# Patient Record
Sex: Female | Born: 1935 | Race: White | Hispanic: No | Marital: Single | Smoking: Current every day smoker
Health system: Southern US, Community
[De-identification: ages and names within clinical notes are randomized; demographics above are authoritative.]

## PROBLEM LIST (undated history)

## (undated) DIAGNOSIS — F0390 Unspecified dementia without behavioral disturbance: Secondary | ICD-10-CM

## (undated) DIAGNOSIS — D649 Anemia, unspecified: Secondary | ICD-10-CM

## (undated) DIAGNOSIS — K922 Gastrointestinal hemorrhage, unspecified: Secondary | ICD-10-CM

## (undated) DIAGNOSIS — F03A0 Unspecified dementia, mild, without behavioral disturbance, psychotic disturbance, mood disturbance, and anxiety: Secondary | ICD-10-CM

## (undated) DIAGNOSIS — J45909 Unspecified asthma, uncomplicated: Secondary | ICD-10-CM

## (undated) DIAGNOSIS — E46 Unspecified protein-calorie malnutrition: Secondary | ICD-10-CM

## (undated) HISTORY — PX: ABDOMINAL HYSTERECTOMY: SHX81

## (undated) HISTORY — PX: TONSILLECTOMY: SUR1361

## (undated) HISTORY — PX: APPENDECTOMY: SHX54

---

## 2012-11-14 ENCOUNTER — Other Ambulatory Visit (HOSPITAL_COMMUNITY): Payer: Self-pay | Admitting: Family Medicine

## 2012-11-14 DIAGNOSIS — Z139 Encounter for screening, unspecified: Secondary | ICD-10-CM

## 2012-11-18 ENCOUNTER — Other Ambulatory Visit (HOSPITAL_COMMUNITY): Payer: Self-pay

## 2014-08-20 ENCOUNTER — Ambulatory Visit (HOSPITAL_COMMUNITY)
Admission: RE | Admit: 2014-08-20 | Discharge: 2014-08-20 | Disposition: A | Discharge status: Home / Self Care | Payer: Medicare Other | Source: Ambulatory Visit | Attending: Physician Assistant | Admitting: Physician Assistant

## 2014-08-20 ENCOUNTER — Other Ambulatory Visit (HOSPITAL_COMMUNITY): Payer: Self-pay | Admitting: Physician Assistant

## 2014-08-20 DIAGNOSIS — R10819 Abdominal tenderness, unspecified site: Secondary | ICD-10-CM

## 2014-08-20 DIAGNOSIS — R52 Pain, unspecified: Secondary | ICD-10-CM

## 2014-08-20 DIAGNOSIS — R112 Nausea with vomiting, unspecified: Secondary | ICD-10-CM

## 2014-08-20 DIAGNOSIS — K921 Melena: Secondary | ICD-10-CM | POA: Diagnosis not present

## 2014-08-21 ENCOUNTER — Encounter (HOSPITAL_COMMUNITY): Payer: Self-pay | Admitting: Emergency Medicine

## 2014-08-21 ENCOUNTER — Emergency Department (HOSPITAL_COMMUNITY): Payer: Medicare Other

## 2014-08-21 ENCOUNTER — Inpatient Hospital Stay (HOSPITAL_COMMUNITY)
Admission: EM | Admit: 2014-08-21 | Discharge: 2014-08-25 | DRG: 377 | Disposition: A | Discharge status: Home / Self Care | Payer: Medicare Other | Attending: Family Medicine | Admitting: Family Medicine

## 2014-08-21 DIAGNOSIS — D500 Iron deficiency anemia secondary to blood loss (chronic): Secondary | ICD-10-CM | POA: Diagnosis present

## 2014-08-21 DIAGNOSIS — K644 Residual hemorrhoidal skin tags: Secondary | ICD-10-CM | POA: Diagnosis present

## 2014-08-21 DIAGNOSIS — D508 Other iron deficiency anemias: Secondary | ICD-10-CM

## 2014-08-21 DIAGNOSIS — J45909 Unspecified asthma, uncomplicated: Secondary | ICD-10-CM | POA: Diagnosis present

## 2014-08-21 DIAGNOSIS — K921 Melena: Principal | ICD-10-CM | POA: Diagnosis present

## 2014-08-21 DIAGNOSIS — F17200 Nicotine dependence, unspecified, uncomplicated: Secondary | ICD-10-CM | POA: Diagnosis present

## 2014-08-21 DIAGNOSIS — K449 Diaphragmatic hernia without obstruction or gangrene: Secondary | ICD-10-CM | POA: Diagnosis present

## 2014-08-21 DIAGNOSIS — K5730 Diverticulosis of large intestine without perforation or abscess without bleeding: Secondary | ICD-10-CM | POA: Diagnosis present

## 2014-08-21 DIAGNOSIS — D62 Acute posthemorrhagic anemia: Secondary | ICD-10-CM | POA: Diagnosis present

## 2014-08-21 DIAGNOSIS — K639 Disease of intestine, unspecified: Secondary | ICD-10-CM | POA: Diagnosis present

## 2014-08-21 DIAGNOSIS — Z681 Body mass index (BMI) 19 or less, adult: Secondary | ICD-10-CM

## 2014-08-21 DIAGNOSIS — D649 Anemia, unspecified: Secondary | ICD-10-CM | POA: Diagnosis present

## 2014-08-21 DIAGNOSIS — K2990 Gastroduodenitis, unspecified, without bleeding: Secondary | ICD-10-CM | POA: Diagnosis present

## 2014-08-21 DIAGNOSIS — E43 Unspecified severe protein-calorie malnutrition: Secondary | ICD-10-CM | POA: Diagnosis present

## 2014-08-21 DIAGNOSIS — K2970 Gastritis, unspecified, without bleeding: Secondary | ICD-10-CM | POA: Diagnosis present

## 2014-08-21 DIAGNOSIS — F0390 Unspecified dementia without behavioral disturbance: Secondary | ICD-10-CM | POA: Diagnosis present

## 2014-08-21 DIAGNOSIS — J4530 Mild persistent asthma, uncomplicated: Secondary | ICD-10-CM

## 2014-08-21 DIAGNOSIS — D509 Iron deficiency anemia, unspecified: Secondary | ICD-10-CM

## 2014-08-21 HISTORY — DX: Unspecified asthma, uncomplicated: J45.909

## 2014-08-21 LAB — CBC WITH DIFFERENTIAL/PLATELET
BASOS ABS: 0 10*3/uL (ref 0.0–0.1)
BASOS PCT: 0 % (ref 0–1)
EOS ABS: 0.1 10*3/uL (ref 0.0–0.7)
EOS PCT: 1 % (ref 0–5)
HCT: 18.9 % — ABNORMAL LOW (ref 36.0–46.0)
Hemoglobin: 6 g/dL — CL (ref 12.0–15.0)
LYMPHS ABS: 1.1 10*3/uL (ref 0.7–4.0)
Lymphocytes Relative: 23 % (ref 12–46)
MCH: 29.9 pg (ref 26.0–34.0)
MCHC: 31.7 g/dL (ref 30.0–36.0)
MCV: 94 fL (ref 78.0–100.0)
Monocytes Absolute: 0.4 10*3/uL (ref 0.1–1.0)
Monocytes Relative: 9 % (ref 3–12)
NEUTROS PCT: 66 % (ref 43–77)
Neutro Abs: 3 10*3/uL (ref 1.7–7.7)
PLATELETS: 233 10*3/uL (ref 150–400)
RBC: 2.01 MIL/uL — ABNORMAL LOW (ref 3.87–5.11)
RDW: 15.6 % — AB (ref 11.5–15.5)
WBC: 4.6 10*3/uL (ref 4.0–10.5)

## 2014-08-21 LAB — BASIC METABOLIC PANEL
Anion gap: 8 (ref 5–15)
BUN: 31 mg/dL — ABNORMAL HIGH (ref 6–23)
CALCIUM: 8.8 mg/dL (ref 8.4–10.5)
CO2: 27 mEq/L (ref 19–32)
Chloride: 105 mEq/L (ref 96–112)
Creatinine, Ser: 0.62 mg/dL (ref 0.50–1.10)
GFR, EST NON AFRICAN AMERICAN: 84 mL/min — AB (ref >90)
GLUCOSE: 103 mg/dL — AB (ref 70–99)
Potassium: 4.1 mEq/L (ref 3.7–5.3)
SODIUM: 140 meq/L (ref 137–147)

## 2014-08-21 LAB — HEPATIC FUNCTION PANEL
ALBUMIN: 3.2 g/dL — AB (ref 3.5–5.2)
ALK PHOS: 53 U/L (ref 39–117)
ALT: 11 U/L (ref 0–35)
AST: 17 U/L (ref 0–37)
Bilirubin, Direct: <0.2 mg/dL (ref 0.0–0.3)
Total Bilirubin: 0.2 mg/dL — ABNORMAL LOW (ref 0.3–1.2)
Total Protein: 5.6 g/dL — ABNORMAL LOW (ref 6.0–8.3)

## 2014-08-21 LAB — RETICULOCYTES
RBC.: 2.04 MIL/uL — AB (ref 3.87–5.11)
RETIC CT PCT: 10.7 % — AB (ref 0.4–3.1)
Retic Count, Absolute: 218.3 10*3/uL — ABNORMAL HIGH (ref 19.0–186.0)

## 2014-08-21 LAB — ABO/RH: ABO/RH(D): O POS

## 2014-08-21 LAB — PREPARE RBC (CROSSMATCH)

## 2014-08-21 MED ORDER — MOMETASONE FURO-FORMOTEROL FUM 100-5 MCG/ACT IN AERO
2.0000 | INHALATION_SPRAY | Freq: Two times a day (BID) | RESPIRATORY_TRACT | Status: DC
  Administered 2014-08-22 – 2014-08-25 (×6): 2 via RESPIRATORY_TRACT
  Filled 2014-08-21 (×2): qty 8.8

## 2014-08-21 MED ORDER — ONDANSETRON HCL 4 MG/2ML IJ SOLN: 4.0000 mg | Freq: Four times a day (QID) | INTRAMUSCULAR | Status: DC | PRN

## 2014-08-21 MED ORDER — IPRATROPIUM BROMIDE HFA 17 MCG/ACT IN AERS: 2.0000 | INHALATION_SPRAY | Freq: Four times a day (QID) | RESPIRATORY_TRACT | Status: DC

## 2014-08-21 MED ORDER — SODIUM CHLORIDE 0.9 % IV SOLN: 250.0000 mL | INTRAVENOUS | Status: DC | PRN

## 2014-08-21 MED ORDER — ONDANSETRON HCL 4 MG PO TABS: 4.0000 mg | ORAL_TABLET | Freq: Four times a day (QID) | ORAL | Status: DC | PRN

## 2014-08-21 MED ORDER — ACETAMINOPHEN 650 MG RE SUPP: 650.0000 mg | Freq: Four times a day (QID) | RECTAL | Status: DC | PRN

## 2014-08-21 MED ORDER — SODIUM CHLORIDE 0.9 % IJ SOLN
3.0000 mL | Freq: Two times a day (BID) | INTRAMUSCULAR | Status: DC
  Administered 2014-08-22 – 2014-08-25 (×8): 3 mL via INTRAVENOUS

## 2014-08-21 MED ORDER — PANTOPRAZOLE SODIUM 40 MG IV SOLR
40.0000 mg | Freq: Once | INTRAVENOUS | Status: AC
  Administered 2014-08-21: 40 mg via INTRAVENOUS
  Filled 2014-08-21: qty 40

## 2014-08-21 MED ORDER — SODIUM CHLORIDE 0.9 % IV SOLN
Freq: Once | INTRAVENOUS | Status: AC
  Administered 2014-08-21: 22:00:00 via INTRAVENOUS

## 2014-08-21 MED ORDER — SODIUM CHLORIDE 0.9 % IJ SOLN: 3.0000 mL | INTRAMUSCULAR | Status: DC | PRN

## 2014-08-21 MED ORDER — ACETAMINOPHEN 325 MG PO TABS
650.0000 mg | ORAL_TABLET | Freq: Four times a day (QID) | ORAL | Status: DC | PRN
  Administered 2014-08-22 – 2014-08-25 (×4): 650 mg via ORAL
  Filled 2014-08-21 (×4): qty 2

## 2014-08-21 MED ORDER — PANTOPRAZOLE SODIUM 40 MG IV SOLR
40.0000 mg | Freq: Two times a day (BID) | INTRAVENOUS | Status: DC
  Administered 2014-08-22 – 2014-08-24 (×6): 40 mg via INTRAVENOUS
  Filled 2014-08-21 (×6): qty 40

## 2014-08-21 NOTE — ED Notes (Signed)
CRITICAL VALUE ALERT  Critical value received: 6.0  Date of notification:  08/21/2014  Time of notification:  2134  Critical value read back:Yes.    Nurse who received alert:  *SB**  MD notified (1st page):  2134   Time of first page:  2134  MD notified (2nd page):  Time of second page:  Responding MD:  ZAMMIT  Time MD responded:               2134

## 2014-08-21 NOTE — H&P (Addendum)
PCP:   Quinn Axe, PA-C   Chief Complaint:  Generalized weakness  HPI: 80 year old female with a history of asthma who came to the ED with chief complaint of generalized weakness and black stools. Patient says that she has been having these stools over the past one month and has noticed worsening fatigue and weakness. She admits to having some shortness of breath on exertion and dizziness on walking but denies passing out. Patient denies taking NSAIDs including aspirin or Motrin for pain she takes acetaminophen for pain. She denies any vomiting, but admits to having some nausea. She denies chest pain , no fever or dysuria. She denies diarrhea. In the ED patient found to have guaiac-positive stools, with anemia with hemoglobin of 6.0. 1 unit of blood transfusion has been ordered by the ED physician.  Allergies:   Allergies  Allergen Reactions  . Strawberry       Past Medical History  Diagnosis Date  . Asthma     Past Surgical History  Procedure Laterality Date  . Abdominal hysterectomy    . Tonsillectomy    . Appendectomy      Prior to Admission medications   Medication Sig Start Date End Date Taking? Authorizing Provider  Esomeprazole Magnesium (NEXIUM PO) Take 22.3 mg by mouth daily.   Yes Historical Provider, MD  ferrous sulfate 325 (65 FE) MG tablet Take 325 mg by mouth daily with breakfast.   Yes Historical Provider, MD  Fluticasone-Salmeterol (ADVAIR) 250-50 MCG/DOSE AEPB Inhale 1 puff into the lungs 2 (two) times daily.   Yes Historical Provider, MD  ipratropium (ATROVENT HFA) 17 MCG/ACT inhaler Inhale 2 puffs into the lungs 4 (four) times daily.   Yes Historical Provider, MD  Multiple Vitamins-Minerals (COMPLETE WOMENS) TABS Take 1 tablet by mouth daily.   Yes Historical Provider, MD    Social History:  reports that she has been smoking.  She does not have any smokeless tobacco history on file. She reports that she drinks alcohol. Her drug history is not  on file.  History reviewed. No pertinent family history.   All the positives are listed in BOLD  Review of Systems:  HEENT: Headache, blurred vision, runny nose, sore throat Neck: Hypothyroidism, hyperthyroidism,,lymphadenopathy Chest : Shortness of breath, history of COPD, Asthma Heart : Chest pain, history of coronary arterey disease GI:  Nausea, vomiting, diarrhea, constipation, GERD GU: Dysuria, urgency, frequency of urination, hematuria Neuro: Stroke, seizures, syncope Psych: Depression, anxiety, hallucinations   Physical Exam: Blood pressure 107/58, pulse 84, temperature 98 F (36.7 C), temperature source Oral, resp. rate 22, height 5' 2.5" (1.588 m), weight 43.545 kg (96 lb), SpO2 100.00%. Constitutional:   Patient is a well-developed and well-nourished female* in no acute distress and cooperative with exam. Head: Normocephalic and atraumatic Mouth: Mucus membranes moist Eyes: PERRL, EOMI, conjunctivae normal Neck: Supple, No Thyromegaly Cardiovascular: RRR, S1 normal, S2 normal Pulmonary/Chest: CTAB, no wheezes, rales, or rhonchi Abdominal: Soft. Non-tender, non-distended, bowel sounds are normal, no masses, organomegaly, or guarding present.  Neurological: A&O x3, Strenght is normal and symmetric bilaterally, cranial nerve II-XII are grossly intact, no focal motor deficit, sensory intact to light touch bilaterally.  Extremities : No Cyanosis, Clubbing or Edema  Labs on Admission:  Basic Metabolic Panel:  Recent Labs Lab 08/21/14 2100  NA 140  K 4.1  CL 105  CO2 27  GLUCOSE 103*  BUN 31*  CREATININE 0.62  CALCIUM 8.8   Liver Function Tests:  Recent Labs Lab 08/21/14 2100  AST 17  ALT 11  ALKPHOS 53  BILITOT 0.2*  PROT 5.6*  ALBUMIN 3.2*   No results found for this basename: LIPASE, AMYLASE,  in the last 168 hours No results found for this basename: AMMONIA,  in the last 168 hours CBC:  Recent Labs Lab 08/21/14 2100  WBC 4.6  NEUTROABS 3.0    HGB 6.0*  HCT 18.9*  MCV 94.0  PLT 233   C Radiological Exams on Admission: Dg Abd 1 View  08/20/2014   CLINICAL DATA:  Tenderness.  Nausea and vomiting .  EXAM: ABDOMEN - 1 VIEW  COMPARISON:  None.  FINDINGS: Soft tissue structures unremarkable. Gas pattern nonspecific. Rounded prominent calcific density in the pelvis. This could represent calcified fibroid or dermoid. Punctate calcification lower pelvis consistent with phlebolith. Degenerative changes lumbar spine and both hips. No acute bony abnormality.  IMPRESSION: 1. No acute abnormality.  No bowel distention. 2. Coarse calcification pelvis most likely calcified fibroid or dermoid.   Electronically Signed   By: Maisie Fus  Register   On: 08/20/2014 17:23   Dg Chest Portable 1 View  08/21/2014   CLINICAL DATA:  Weakness and shortness of breath.  EXAM: PORTABLE CHEST - 1 VIEW  COMPARISON:  12/01/2013  FINDINGS: Normal heart size and pulmonary vascularity. Emphysematous changes and scattered fibrosis in the lungs. No focal airspace disease or consolidation. No blunting of costophrenic angles. No pneumothorax. Calcification and torsion of the aorta.  IMPRESSION: Emphysematous changes in the lungs. No evidence of active pulmonary disease.   Electronically Signed   By: Burman Nieves M.D.   On: 08/21/2014 22:12    EKG: Independently reviewed. Sinus rhythm, nonspecific ST-T changes   Assessment/Plan Principal Problem:   Anemia Active Problems:   Melena   Asthma, chronic  Anemia Likely due to GI bleed, patient has guaiac-positive stools. Will transfuse 2 units PRBC Follow CBC in a.m.  Melena ? Cause, patient denies NSAID use. We'll keep the patient n.p.o. obtain GI consult in the a.m. for possible upper GI endoscopy. Will continue Protonix IV 40 mg every 12 hours.  Chronic asthma Continue Advair, ipratropium.   Code status: Patient is full code  Family discussion: No family at bedside   Time Spent on Admission: 60  minutes  Melanie Holland S [facility] Pager: [phone] 08/22/2014, 12:01 AM  If 7PM-7AM, please contact night-coverage  [email or web address]  Password [email or web address]

## 2014-08-21 NOTE — ED Provider Notes (Signed)
CSN: [id number]     Arrival date & time 08/21/14  2027 History  This chart was scribed for Benny Lennert, MD by Modena Jansky, ED Scribe. This patient was seen in room APA18/APA18 and the patient's care was started at 9:26 PM.  Chief Complaint  Patient presents with  . Weakness   Patient is a 80 y.o. female presenting with weakness and hematochezia. The history is provided by the patient. No language interpreter was used.  Weakness This is a new problem. The current episode started 12 to 24 hours ago. Pertinent negatives include no chest pain, no abdominal pain and no headaches. Nothing aggravates the symptoms. Nothing relieves the symptoms. She has tried nothing for the symptoms.  Rectal Bleeding Quality:  Black and tarry Amount:  Moderate Duration:  1 month Timing:  Intermittent Progression:  Unchanged Chronicity:  New Relieved by:  None tried Worsened by:  Nothing tried Ineffective treatments:  None tried Associated symptoms: no abdominal pain    HPI Comments: Melanie Holland is a 80 y.o. female who presents to the Emergency Department complaining of black stools that started a month ago. She states that she has also been feeling weak and nauseous today. She reports some neck pain. She states that she currently lives with her brother. She denies any fever or chills.   Past Medical History  Diagnosis Date  . Asthma    Past Surgical History  Procedure Laterality Date  . Abdominal hysterectomy    . Tonsillectomy    . Appendectomy     History reviewed. No pertinent family history. History  Substance Use Topics  . Smoking status: Current Every Day Smoker  . Smokeless tobacco: Not on file  . Alcohol Use: Yes     Comment: occasionally   OB History   Grav Para Term Preterm Abortions TAB SAB Ect Mult Living                 Review of Systems  Constitutional: Negative for appetite change and fatigue.  HENT: Negative for congestion, ear discharge and sinus pressure.   Eyes:  Negative for discharge.  Respiratory: Negative for cough.   Cardiovascular: Negative for chest pain.  Gastrointestinal: Positive for nausea and hematochezia. Negative for abdominal pain and diarrhea.  Genitourinary: Negative for frequency and hematuria.  Musculoskeletal: Positive for neck pain. Negative for back pain.  Skin: Negative for rash.  Neurological: Positive for weakness. Negative for seizures and headaches.  Psychiatric/Behavioral: Negative for hallucinations.      Allergies  Strawberry  Home Medications   Prior to Admission medications   Medication Sig Start Date End Date Taking? Authorizing Provider  Esomeprazole Magnesium (NEXIUM PO) Take 22.3 mg by mouth daily.   Yes Historical Provider, MD  ferrous sulfate 325 (65 FE) MG tablet Take 325 mg by mouth daily with breakfast.   Yes Historical Provider, MD  Fluticasone-Salmeterol (ADVAIR) 250-50 MCG/DOSE AEPB Inhale 1 puff into the lungs 2 (two) times daily.   Yes Historical Provider, MD  ipratropium (ATROVENT HFA) 17 MCG/ACT inhaler Inhale 2 puffs into the lungs 4 (four) times daily.   Yes Historical Provider, MD  Multiple Vitamins-Minerals (COMPLETE WOMENS) TABS Take 1 tablet by mouth daily.   Yes Historical Provider, MD   BP 110/57  Pulse 87  Temp(Src) 97.8 F (36.6 C) (Oral)  Resp 18  Ht 5' 2.5" (1.588 m)  Wt 92 lb (41.731 kg)  BMI 16.55 kg/m2  SpO2 100% Physical Exam  Nursing note and vitals reviewed. Constitutional: She  is oriented to person, place, and time. She appears well-developed.  Mildy confused. Cachetic.  HENT:  Head: Normocephalic.  Eyes: Conjunctivae and EOM are normal. No scleral icterus.  Neck: Neck supple. No thyromegaly present.  Cardiovascular: Normal rate and regular rhythm.  Exam reveals no gallop and no friction rub.   No murmur heard. Pulmonary/Chest: No stridor. She has no wheezes. She has no rales. She exhibits no tenderness.  Abdominal: She exhibits no distension. There is no  tenderness. There is no rebound.  Genitourinary:  Rectal exam is normal with heme positive.   Musculoskeletal: Normal range of motion. She exhibits no edema.  Lymphadenopathy:    She has no cervical adenopathy.  Neurological: She is oriented to person, place, and time. She exhibits normal muscle tone. Coordination normal.  Skin: No rash noted. No erythema.  Psychiatric: She has a normal mood and affect. Her behavior is normal.    ED Course  Procedures (including critical care time) DIAGNOSTIC STUDIES: Oxygen Saturation is 100% on RA, normal by my interpretation.    COORDINATION OF CARE: 9:30 PM- Pt advised of plan for treatment which includes labs and pt agrees.  Labs Review Labs Reviewed  CBC WITH DIFFERENTIAL - Abnormal; Notable for the following:    RBC 2.01 (*)    Hemoglobin 6.0 (*)    HCT 18.9 (*)    RDW 15.6 (*)    All other components within normal limits  BASIC METABOLIC PANEL - Abnormal; Notable for the following:    Glucose, Bld 103 (*)    BUN 31 (*)    GFR calc non Af Amer 84 (*)    All other components within normal limits  HEPATIC FUNCTION PANEL    Imaging Review Dg Abd 1 View  08/20/2014   CLINICAL DATA:  Tenderness.  Nausea and vomiting .  EXAM: ABDOMEN - 1 VIEW  COMPARISON:  None.  FINDINGS: Soft tissue structures unremarkable. Gas pattern nonspecific. Rounded prominent calcific density in the pelvis. This could represent calcified fibroid or dermoid. Punctate calcification lower pelvis consistent with phlebolith. Degenerative changes lumbar spine and both hips. No acute bony abnormality.  IMPRESSION: 1. No acute abnormality.  No bowel distention. 2. Coarse calcification pelvis most likely calcified fibroid or dermoid.   Electronically Signed   By: Maisie Fus  Register   On: 08/20/2014 17:23     EKG Interpretation None     CRITICAL CARE Performed by: Eevie Lapp L Total critical care time: 35 Critical care time was exclusive of separately billable  procedures and treating other patients. Critical care was necessary to treat or prevent imminent or life-threatening deterioration. Critical care was time spent personally by me on the following activities: development of treatment plan with patient and/or surrogate as well as nursing, discussions with consultants, evaluation of patient's response to treatment, examination of patient, obtaining history from patient or surrogate, ordering and performing treatments and interventions, ordering and review of laboratory studies, ordering and review of radiographic studies, pulse oximetry and re-evaluation of patient's condition.   MDM   Final diagnoses:  None    The chart was scribed for me under my direct supervision.  I personally performed the history, physical, and medical decision making and all procedures in the evaluation of this patient.Benny Lennert, MD 08/21/14 2234

## 2014-08-21 NOTE — ED Notes (Signed)
Pt brought in by [facility] for c/o weakness, pt has had nausea and black tarry stools; pt c/o left lower quad pain

## 2014-08-21 NOTE — ED Notes (Signed)
Report given to [facility] on 300. Pt's brother and sister-in-law informed of room number and took pt's pocketbook and meds with them

## 2014-08-22 ENCOUNTER — Encounter (HOSPITAL_COMMUNITY)
Admission: EM | Disposition: A | Discharge status: Home / Self Care | Payer: Self-pay | Source: Home / Self Care | Attending: Internal Medicine

## 2014-08-22 DIAGNOSIS — D649 Anemia, unspecified: Secondary | ICD-10-CM

## 2014-08-22 DIAGNOSIS — K259 Gastric ulcer, unspecified as acute or chronic, without hemorrhage or perforation: Secondary | ICD-10-CM

## 2014-08-22 DIAGNOSIS — K921 Melena: Secondary | ICD-10-CM

## 2014-08-22 DIAGNOSIS — K449 Diaphragmatic hernia without obstruction or gangrene: Secondary | ICD-10-CM

## 2014-08-22 DIAGNOSIS — J45909 Unspecified asthma, uncomplicated: Secondary | ICD-10-CM | POA: Diagnosis present

## 2014-08-22 HISTORY — PX: ESOPHAGOGASTRODUODENOSCOPY: SHX5428

## 2014-08-22 LAB — PROTIME-INR
INR: 1.04 (ref 0.00–1.49)
PROTHROMBIN TIME: 13.6 s (ref 11.6–15.2)

## 2014-08-22 LAB — CBC
HCT: 18.2 % — ABNORMAL LOW (ref 36.0–46.0)
Hemoglobin: 5.9 g/dL — CL (ref 12.0–15.0)
MCH: 30.4 pg (ref 26.0–34.0)
MCHC: 32.4 g/dL (ref 30.0–36.0)
MCV: 93.8 fL (ref 78.0–100.0)
Platelets: 229 10*3/uL (ref 150–400)
RBC: 1.94 MIL/uL — ABNORMAL LOW (ref 3.87–5.11)
RDW: 15.9 % — AB (ref 11.5–15.5)
WBC: 4.9 10*3/uL (ref 4.0–10.5)

## 2014-08-22 LAB — COMPREHENSIVE METABOLIC PANEL
ALBUMIN: 2.9 g/dL — AB (ref 3.5–5.2)
ALK PHOS: 50 U/L (ref 39–117)
ALT: 12 U/L (ref 0–35)
AST: 19 U/L (ref 0–37)
Anion gap: 10 (ref 5–15)
BUN: 28 mg/dL — ABNORMAL HIGH (ref 6–23)
CHLORIDE: 106 meq/L (ref 96–112)
CO2: 25 mEq/L (ref 19–32)
Calcium: 8.3 mg/dL — ABNORMAL LOW (ref 8.4–10.5)
Creatinine, Ser: 0.54 mg/dL (ref 0.50–1.10)
GFR calc Af Amer: >90 mL/min (ref >90)
GFR calc non Af Amer: 87 mL/min — ABNORMAL LOW (ref >90)
Glucose, Bld: 95 mg/dL (ref 70–99)
POTASSIUM: 4 meq/L (ref 3.7–5.3)
SODIUM: 141 meq/L (ref 137–147)
TOTAL PROTEIN: 5.1 g/dL — AB (ref 6.0–8.3)
Total Bilirubin: 0.2 mg/dL — ABNORMAL LOW (ref 0.3–1.2)

## 2014-08-22 LAB — VITAMIN B12: VITAMIN B 12: 549 pg/mL (ref 211–911)

## 2014-08-22 LAB — IRON AND TIBC
Iron: 11 ug/dL — ABNORMAL LOW (ref 42–135)
SATURATION RATIOS: 4 % — AB (ref 20–55)
TIBC: 280 ug/dL (ref 250–470)
UIBC: 269 ug/dL (ref 125–400)

## 2014-08-22 LAB — HEMOGLOBIN AND HEMATOCRIT, BLOOD
HCT: 27.6 % — ABNORMAL LOW (ref 36.0–46.0)
Hemoglobin: 9.2 g/dL — ABNORMAL LOW (ref 12.0–15.0)

## 2014-08-22 LAB — FOLATE: Folate: >20 ng/mL

## 2014-08-22 LAB — FERRITIN: Ferritin: 12 ng/mL (ref 10–291)

## 2014-08-22 LAB — PREPARE RBC (CROSSMATCH)

## 2014-08-22 SURGERY — CANCELLED PROCEDURE

## 2014-08-22 SURGERY — EGD (ESOPHAGOGASTRODUODENOSCOPY)
Anesthesia: Moderate Sedation

## 2014-08-22 MED ORDER — IPRATROPIUM BROMIDE 0.02 % IN SOLN
0.5000 mg | Freq: Four times a day (QID) | RESPIRATORY_TRACT | Status: DC
  Administered 2014-08-22 – 2014-08-25 (×14): 0.5 mg via RESPIRATORY_TRACT
  Filled 2014-08-22 (×14): qty 2.5

## 2014-08-22 MED ORDER — SODIUM CHLORIDE 0.9 % IV SOLN: Freq: Once | INTRAVENOUS | Status: DC

## 2014-08-22 MED ORDER — MIDAZOLAM HCL 5 MG/5ML IJ SOLN
INTRAMUSCULAR | Status: DC | PRN
  Administered 2014-08-22 (×3): 1 mg via INTRAVENOUS

## 2014-08-22 MED ORDER — MEPERIDINE HCL 50 MG/ML IJ SOLN
INTRAMUSCULAR | Status: AC
  Filled 2014-08-22: qty 1

## 2014-08-22 MED ORDER — MIDAZOLAM HCL 5 MG/5ML IJ SOLN
INTRAMUSCULAR | Status: AC
  Filled 2014-08-22: qty 10

## 2014-08-22 MED ORDER — MEPERIDINE HCL 50 MG/ML IJ SOLN
INTRAMUSCULAR | Status: DC | PRN
  Administered 2014-08-22: 20 mg via INTRAVENOUS
  Administered 2014-08-22: 10 mg via INTRAVENOUS

## 2014-08-22 MED ORDER — BUTAMBEN-TETRACAINE-BENZOCAINE 2-2-14 % EX AERO
INHALATION_SPRAY | CUTANEOUS | Status: DC | PRN
  Administered 2014-08-22: 2 via TOPICAL

## 2014-08-22 MED ORDER — SODIUM CHLORIDE 0.9 % IV SOLN
Freq: Once | INTRAVENOUS | Status: AC
  Administered 2014-08-22: 08:00:00 via INTRAVENOUS

## 2014-08-22 NOTE — Progress Notes (Signed)
Utilization review Completed Nickalous Stingley RN BSN   

## 2014-08-22 NOTE — Op Note (Signed)
EGD PROCEDURE REPORT  PATIENT:  Melanie Holland  MR#:  [id number] Birthdate:  1932/05/16, 80 y.o., female Endoscopist:  Dr. Malissa Hippo, MD Referred By:  Dr. Cathren Harsh, MD Procedure Date: 08/22/2014  Procedure:   EGD  Indications:  Patient is a 80 year old Caucasian female who presents with few week history of melena and hemoglobin of 6 g. She has been using Advil on an as-needed basis but not daily. She is undergoing diagnostic EGD. She does finishing up on second unit of PRBCs.            Informed Consent:  The risks, benefits, alternatives & imponderables which include, but are not limited to, bleeding, infection, perforation, drug reaction and potential missed lesion have been reviewed.  The potential for biopsy, lesion removal, esophageal dilation, etc. have also been discussed.  Questions have been answered.  All parties agreeable.  Please see history & physical in medical record for more information.  Medications:  Demerol 30 mg IV Versed 3 mg IV Cetacaine spray topically for oropharyngeal anesthesia  Description of procedure:  The endoscope was introduced through the mouth and advanced to the second portion of the duodenum without difficulty or limitations. The mucosal surfaces were surveyed very carefully during advancement of the scope and upon withdrawal.  Findings:  Esophagus:  Mucosa of the esophagus was normal. GEJ was unremarkable. GEJ:  37 cm Hiatus:  39 cm Stomach:  Stomach was empty and distended very well with insufflation. Folds in the proximal stomach were normal. Examination of mucosa at the gastric body and antrum was normal. Single prepyloric erosion noted. Pyloric channel was patent. Angularis fundus and cardia were unremarkable. Duodenum:  Focal mucosal none t noted just past the pylorus secondary to Brunner's gland hypertrophy. Focal edema and erythema noted persistent erosion but no ulcer identified. Post bulbar mucosa was  normal.  Therapeutic/Diagnostic Maneuvers Performed:  None  Complications:  None  Impression: Small sliding hiatal hernia. Gastroduodenitis. No bleeding lesion identified.  Recommendations:  Clear liquid diet. H. pylori serology. Colonoscopy if patient is agreeable.  REHMAN,NAJEEB U  08/22/2014  2:29 PM  CC: Dr. Quinn Axe, PA-C & Dr. Bonnetta Barry ref. provider found

## 2014-08-22 NOTE — Progress Notes (Signed)
Patient ID: Melanie Holland  female  ZOX:[id number]    DOB: 1932-02-20    DOA: 08/21/2014  PCP: Quinn Axe, PA-C  Assessment/Plan: Principal Problem: Acute on chronic Severe  Anemia likely due to GI bleed, patient reports melanotic stools for a month - Will transfuse 2 more units of packed rbc's, - Follow anemia panel, stool occult test - Patient will need endoscopy, GI consulted - Continue n.p.o. status, gentle hydration -Continue IV PPI    Active Problems:   Melena - As #1    Asthma, chronic - Currently stable   DVT Prophylaxis: SCDs   Code Status: full code   Family Communication:  Disposition:  Consultants:  Gastroenterology   Procedures:  None   Antibiotics:  None     Subjective: Patient seen and examined, denies any specific complaints, not active GI bleeding   Objective: Weight change:   Intake/Output Summary (Last 24 hours) at 08/22/14 0937 Last data filed at 08/22/14 0826  Gross per 24 hour  Intake  137.5 ml  Output      0 ml  Net  137.5 ml   Blood pressure 96/59, pulse 79, temperature 98.2 F (36.8 C), temperature source Oral, resp. rate 18, height 5' 2.5" (1.588 m), weight 43.545 kg (96 lb), SpO2 100.00%.  Physical Exam: General: Alert and awake, oriented, not in any acute distress. CVS: S1-S2 clear, no murmur rubs or gallops Chest: clear to auscultation bilaterally, no wheezing, rales or rhonchi Abdomen: soft nontender, nondistended, normal bowel sounds  Extremities: no cyanosis, clubbing or edema noted bilaterally   Lab Results: Basic Metabolic Panel:  Recent Labs Lab 08/21/14 2100 08/22/14 0656  NA 140 141  K 4.1 4.0  CL 105 106  CO2 27 25  GLUCOSE 103* 95  BUN 31* 28*  CREATININE 0.62 0.54  CALCIUM 8.8 8.3*   Liver Function Tests:  Recent Labs Lab 08/21/14 2100 08/22/14 0656  AST 17 19  ALT 11 12  ALKPHOS 53 50  BILITOT 0.2* 0.2*  PROT 5.6* 5.1*  ALBUMIN 3.2* 2.9*   No results found for this  basename: LIPASE, AMYLASE,  in the last 168 hours No results found for this basename: AMMONIA,  in the last 168 hours CBC:  Recent Labs Lab 08/21/14 2100 08/22/14 0656  WBC 4.6 4.9  NEUTROABS 3.0  --   HGB 6.0* 5.9*  HCT 18.9* 18.2*  MCV 94.0 93.8  PLT 233 229   Cardiac Enzymes: No results found for this basename: CKTOTAL, CKMB, CKMBINDEX, TROPONINI,  in the last 168 hours BNP: No components found with this basename: POCBNP,  CBG: No results found for this basename: GLUCAP,  in the last 168 hours   Micro Results: No results found for this or any previous visit (from the past 240 hour(s)).  Studies/Results: Dg Abd 1 View  08/20/2014   CLINICAL DATA:  Tenderness.  Nausea and vomiting .  EXAM: ABDOMEN - 1 VIEW  COMPARISON:  None.  FINDINGS: Soft tissue structures unremarkable. Gas pattern nonspecific. Rounded prominent calcific density in the pelvis. This could represent calcified fibroid or dermoid. Punctate calcification lower pelvis consistent with phlebolith. Degenerative changes lumbar spine and both hips. No acute bony abnormality.  IMPRESSION: 1. No acute abnormality.  No bowel distention. 2. Coarse calcification pelvis most likely calcified fibroid or dermoid.   Electronically Signed   By: Maisie Fus  Register   On: 08/20/2014 17:23   Dg Chest Portable 1 View  08/21/2014   CLINICAL DATA:  Weakness and shortness  of breath.  EXAM: PORTABLE CHEST - 1 VIEW  COMPARISON:  12/01/2013  FINDINGS: Normal heart size and pulmonary vascularity. Emphysematous changes and scattered fibrosis in the lungs. No focal airspace disease or consolidation. No blunting of costophrenic angles. No pneumothorax. Calcification and torsion of the aorta.  IMPRESSION: Emphysematous changes in the lungs. No evidence of active pulmonary disease.   Electronically Signed   By: Burman Nieves M.D.   On: 08/21/2014 22:12    Medications: Scheduled Meds: . sodium chloride   Intravenous Once  . ipratropium  0.5 mg  Nebulization QID  . mometasone-formoterol  2 puff Inhalation BID  . pantoprazole (PROTONIX) IV  40 mg Intravenous Q12H  . sodium chloride  3 mL Intravenous Q12H      LOS: 1 day   RAI,RIPUDEEP M.D. [facility] 08/22/2014, 9:37 AM Pager: [phone]  If 7PM-7AM, please contact night-coverage [email or web address] Password [email or web address]  **Disclaimer: This note was dictated with voice recognition software. Similar sounding words can inadvertently be transcribed and this note may contain transcription errors which may not have been corrected upon publication of note.**

## 2014-08-22 NOTE — Progress Notes (Signed)
CRITICAL VALUE ALERT  Critical value received:  Hgb 5.9  Date of notification:  08/22/2014  Time of notification:  730  Critical value read back:Yes.    Nurse who received alert:  Mertha Baars, RN  MD notified (1st page):  Dr. Kerry Hough   Time of first page:  7:31  MD notified (2nd page):  Time of second page:  Responding MD:    Time MD responded:

## 2014-08-22 NOTE — Progress Notes (Signed)
Two UPRBC ordered to be transfused.  The blood had to be sent to [facility] for antibody testing.  Blood bank call [phone] and said that the blood was back from [facility] and ready to be transfused.  Will pass to the day nurse for transfusion.

## 2014-08-22 NOTE — Consult Note (Signed)
Referring Provider: Dr. Cathren Harsh, M.D.  Primary Care Physician:  Lucius Conn Primary Gastroenterologist:  Dr. Karilyn Cota  Reason for Consultation:    Melena and anemia.  HPI:   Patient is 80 year old Caucasian female who has not been feeling well for 3-4 weeks. She's noted progressive weakness and she also had been passing tarry stools but did not realize its significance. Patient was seen by Ms. Merilynn Finland, PA-C and noted to have black tarry stools and anemia and sent over to hospital for evaluation. Patient believes her a list as a result of eating too many strawberries. She denies abdominal pain nausea vomiting heartburn or dysphagia. She denies history of peptic ulcer disease. She takes Advil for headache on as-needed basis. She does not take this medication daily but she could take anywhere from one to 4 tablets per day. Her appetite is fair. She states she did lose some weight several months ago but has gained some back. Lately her weight has been in mid-90s. Patient states she had colonoscopy in [place] about 10 years ago and was normal. Patient denies chest pain hematuria or vaginal bleeding. She also denies diarrhea and/or constipation. 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 She lives with her younger brother. She smokes about half a pack of cigarettes per day which she has done for 45 years. She drinks alcohol occasionally. Her mother lived to be 60 and father died at 12. She lost one sister of accident in her 80s. She has 2 brothers living.    Past Medical History  Diagnosis Date  . Asthma     Past Surgical History  Procedure Laterality Date  . Abdominal hysterectomy    . Tonsillectomy    . Appendectomy      Prior to Admission medications   Medication Sig Start Date End Date Taking? Authorizing Provider  Esomeprazole Magnesium (NEXIUM PO) Take 22.3 mg by mouth daily.   Yes Historical Provider, MD  ferrous sulfate  325 (65 FE) MG tablet Take 325 mg by mouth daily with breakfast.   Yes Historical Provider, MD  Fluticasone-Salmeterol (ADVAIR) 250-50 MCG/DOSE AEPB Inhale 1 puff into the lungs 2 (two) times daily.   Yes Historical Provider, MD  ipratropium (ATROVENT HFA) 17 MCG/ACT inhaler Inhale 2 puffs into the lungs 4 (four) times daily.   Yes Historical Provider, MD  Multiple Vitamins-Minerals (COMPLETE WOMENS) TABS Take 1 tablet by mouth daily.   Yes Historical Provider, MD    Current Facility-Administered Medications  Medication Dose Route Frequency Provider Last Rate Last Dose  . 0.9 %  sodium chloride infusion  250 mL Intravenous PRN Meredeth Ide, MD      . 0.9 %  sodium chloride infusion   Intravenous Once Meredeth Ide, MD      . acetaminophen (TYLENOL) tablet 650 mg  650 mg Oral Q6H PRN Meredeth Ide, MD   650 mg at 08/22/14 0940   Or  . acetaminophen (TYLENOL) suppository 650 mg  650 mg Rectal Q6H PRN Meredeth Ide, MD      . ipratropium (ATROVENT) nebulizer solution 0.5 mg  0.5 mg Nebulization QID Meredeth Ide, MD   0.5 mg at 08/22/14 0859  . mometasone-formoterol (DULERA) 100-5 MCG/ACT inhaler 2 puff  2 puff Inhalation BID Meredeth Ide, MD      . ondansetron [facility]) tablet 4 mg  4 mg Oral Q6H PRN Meredeth Ide, MD       Or  .  ondansetron (ZOFRAN) injection 4 mg  4 mg Intravenous Q6H PRN Meredeth Ide, MD      . pantoprazole (PROTONIX) injection 40 mg  40 mg Intravenous Q12H Meredeth Ide, MD   40 mg at 08/22/14 1048  . sodium chloride 0.9 % injection 3 mL  3 mL Intravenous Q12H Meredeth Ide, MD   3 mL at 08/22/14 1048  . sodium chloride 0.9 % injection 3 mL  3 mL Intravenous PRN Meredeth Ide, MD        Allergies as of 08/21/2014 - Review Complete 08/21/2014  Allergen Reaction Noted  . Strawberry  08/21/2014    History reviewed. No pertinent family history.  History   Social History  . Marital Status: Single    Spouse Name: N/A    Number of Children: N/A  . Years of Education: N/A    Occupational History  . Not on file.   Social History Main Topics  . Smoking status: Current Every Day Smoker  . Smokeless tobacco: Not on file  . Alcohol Use: Yes     Comment: occasionally  . Drug Use: Not on file  . Sexual Activity: Yes    Birth Control/ Protection: Surgical   Other Topics Concern  . Not on file   Social History Narrative  . No narrative on file    Review of Systems: See HPI, otherwise normal ROS  Physical Exam: Temp:  [97.7 F (36.5 C)-98.8 F (37.1 C)] 97.7 F (36.5 C) (08/30 1026) Pulse Rate:  [71-87] 71 (08/30 1026) Resp:  [16-31] 18 (08/30 1026) BP: (96-112)/(49-68) 112/62 mmHg (08/30 1026) SpO2:  [98 %-100 %] 100 % (08/30 1026) Weight:  [92 lb (41.731 kg)-96 lb (43.545 kg)] 96 lb (43.545 kg) (08/30 0603) Last BM Date: 08/21/14 Well-developed thin Caucasian female who is very pale. Conjunctiva is pale. Sclerae nonicteric. Pupils are somewhat dilated but equal and respond to light. Oropharyngeal mucosa is normal. No neck masses or thyromegaly noted.  Cardiac exam with a regular rhythm normal S1 and S2. Grade 2/6 systolic ejection murmur best heard at LLSB. Lungs are clear to auscultation. Abdomen is flat with normal bowel sounds. It is soft with mild midepigastric tenderness. No organomegaly or masses. Extremities are thin but no clubbing noted.   Lab Results:  Recent Labs  08/21/14 2100 08/22/14 0656  WBC 4.6 4.9  HGB 6.0* 5.9*  HCT 18.9* 18.2*  PLT 233 229   BMET  Recent Labs  08/21/14 2100 08/22/14 0656  NA 140 141  K 4.1 4.0  CL 105 106  CO2 27 25  GLUCOSE 103* 95  BUN 31* 28*  CREATININE 0.62 0.54  CALCIUM 8.8 8.3*   LFT  Recent Labs  08/21/14 2100 08/22/14 0656  PROT 5.6* 5.1*  ALBUMIN 3.2* 2.9*  AST 17 19  ALT 11 12  ALKPHOS 53 50  BILITOT 0.2* 0.2*  BILIDIR <0.2  --   IBILI NOT CALCULATED  --    PT/INR No results found for this basename: LABPROT, INR,  in the last 72 hours Hepatitis Panel No  results found for this basename: HEPBSAG, HCVAB, HEPAIGM, HEPBIGM,  in the last 72 hours  Studies/Results: Dg Abd 1 View  08/20/2014   CLINICAL DATA:  Tenderness.  Nausea and vomiting .  EXAM: ABDOMEN - 1 VIEW  COMPARISON:  None.  FINDINGS: Soft tissue structures unremarkable. Gas pattern nonspecific. Rounded prominent calcific density in the pelvis. This could represent calcified fibroid or dermoid. Punctate calcification lower pelvis  consistent with phlebolith. Degenerative changes lumbar spine and both hips. No acute bony abnormality.  IMPRESSION: 1. No acute abnormality.  No bowel distention. 2. Coarse calcification pelvis most likely calcified fibroid or dermoid.   Electronically Signed   By: Maisie Fus  Register   On: 08/20/2014 17:23   Dg Chest Portable 1 View  08/21/2014   CLINICAL DATA:  Weakness and shortness of breath.  EXAM: PORTABLE CHEST - 1 VIEW  COMPARISON:  12/01/2013  FINDINGS: Normal heart size and pulmonary vascularity. Emphysematous changes and scattered fibrosis in the lungs. No focal airspace disease or consolidation. No blunting of costophrenic angles. No pneumothorax. Calcification and torsion of the aorta.  IMPRESSION: Emphysematous changes in the lungs. No evidence of active pulmonary disease.   Electronically Signed   By: Burman Nieves M.D.   On: 08/21/2014 22:12    Assessment;  Patient is 79 year old Caucasian female who presents with several week history of melena and progressive weakness. On admission she is found to have hemoglobin of 6 g but normal MCV. Iron studies as well as B12 and folate levels are pending. Patient is finishing up on first unit of PRBCs. Patient has been taking Advil on a frequent basis for headache and she has midepigastric tenderness. Therefore most likely cause of GI bleed his peptic ulcer disease. Patient is in pantoprazole 40 mg IV every 12 hours.   Recommendations; Will check INR with next blood draw. Esophagogastroduodenoscopy later  today after she has received second unit of PRBCs. Procedure reviewed with patient and she is agreeable.   LOS: 1 day   Lorree Millar U  08/22/2014, 10:59 AM

## 2014-08-23 ENCOUNTER — Encounter (HOSPITAL_COMMUNITY): Payer: Self-pay | Admitting: Internal Medicine

## 2014-08-23 LAB — BASIC METABOLIC PANEL
Anion gap: 11 (ref 5–15)
BUN: 19 mg/dL (ref 6–23)
CALCIUM: 8.4 mg/dL (ref 8.4–10.5)
CO2: 23 mEq/L (ref 19–32)
Chloride: 106 mEq/L (ref 96–112)
Creatinine, Ser: 0.53 mg/dL (ref 0.50–1.10)
GFR, EST NON AFRICAN AMERICAN: 88 mL/min — AB (ref >90)
Glucose, Bld: 78 mg/dL (ref 70–99)
POTASSIUM: 3.9 meq/L (ref 3.7–5.3)
SODIUM: 140 meq/L (ref 137–147)

## 2014-08-23 LAB — OCCULT BLOOD, POC DEVICE: FECAL OCCULT BLD: POSITIVE — AB

## 2014-08-23 LAB — CBC
HEMATOCRIT: 32.2 % — AB (ref 36.0–46.0)
Hemoglobin: 10.8 g/dL — ABNORMAL LOW (ref 12.0–15.0)
MCH: 29 pg (ref 26.0–34.0)
MCHC: 33.5 g/dL (ref 30.0–36.0)
MCV: 86.3 fL (ref 78.0–100.0)
Platelets: 214 10*3/uL (ref 150–400)
RBC: 3.73 MIL/uL — ABNORMAL LOW (ref 3.87–5.11)
RDW: 17.7 % — ABNORMAL HIGH (ref 11.5–15.5)
WBC: 7.4 10*3/uL (ref 4.0–10.5)

## 2014-08-23 LAB — HEMOGLOBIN AND HEMATOCRIT, BLOOD
HEMATOCRIT: 31 % — AB (ref 36.0–46.0)
Hemoglobin: 10.3 g/dL — ABNORMAL LOW (ref 12.0–15.0)

## 2014-08-23 LAB — H. PYLORI ANTIBODY, IGG

## 2014-08-23 LAB — TSH: TSH: 1.02 u[IU]/mL (ref 0.350–4.500)

## 2014-08-23 MED ORDER — PEG 3350-KCL-NA BICARB-NACL 420 G PO SOLR
4000.0000 mL | Freq: Once | ORAL | Status: AC
  Administered 2014-08-23: 4000 mL via ORAL
  Filled 2014-08-23: qty 4000

## 2014-08-23 MED ORDER — SODIUM CHLORIDE 0.9 % IV SOLN
INTRAVENOUS | Status: DC
  Administered 2014-08-23: 11:00:00 via INTRAVENOUS

## 2014-08-23 MED ORDER — SODIUM CHLORIDE 0.9 % IV SOLN: INTRAVENOUS | Status: DC

## 2014-08-23 NOTE — Care Management Note (Addendum)
    Page 1 of 1   08/25/2014     2:58:19 PM CARE MANAGEMENT NOTE 08/25/2014  Patient:  Melanie Holland, Melanie Holland   Account Number:  [id number]  Date Initiated:  08/23/2014  Documentation initiated by:  Kathyrn Sheriff  Subjective/Objective Assessment:   Patient lives at home with brother. Pt is independent with ADL's. Pt receives meals on wheels. Pt has no HH services, DME's or medication needs prior to admission.     Action/Plan:   Pt plans to dsicharge home with self care. No CM needs noted at this time.   Anticipated DC Date:  08/25/2014   Anticipated DC Plan:  HOME/SELF CARE      DC Planning Services  CM consult      Choice offered to / List presented to:             Status of service:  Completed, signed off Medicare Important Message given?  YES (If response is "NO", the following Medicare IM given date fields will be blank) Date Medicare IM given:  08/25/2014 Medicare IM given by:  Kathyrn Sheriff Date Additional Medicare IM given:   Additional Medicare IM given by:    Discharge Disposition:  HOME/SELF CARE  Per UR Regulation:    If discussed at Long Length of Stay Meetings, dates discussed:    Comments:  08/25/2014 1600 Kathyrn Sheriff, RN, MSN, PCCN Pt plans to discharge home with self care today. No CM needs noted at this time. 08/23/2014 1600 Kathyrn Sheriff, RN, MSN, [facility]

## 2014-08-23 NOTE — Progress Notes (Signed)
  Subjective:  Patient has no complaints. She denies nausea vomiting melena or rectal bleeding. She does not remember that she received 3 units of PRBCs yesterday.  Objective: Blood pressure 134/64, pulse 75, temperature 98.5 F (36.9 C), temperature source Oral, resp. rate 20, height 5' 2.5" (1.588 m), weight 96 lb (43.545 kg), SpO2 97.00%. Patient appears less pale than yesterday. Abdomen is flat and soft with mild midepigastric tenderness. No LE edema or clubbing noted.  Labs/studies Results:   Recent Labs  08/21/14 2100 08/22/14 0656 08/22/14 1502 08/23/14 0120 08/23/14 0536  WBC 4.6 4.9  --   --  7.4  HGB 6.0* 5.9* 9.2* 10.3* 10.8*  HCT 18.9* 18.2* 27.6* 31.0* 32.2*  PLT 233 229  --   --  214    BMET   Recent Labs  08/21/14 2100 08/22/14 0656 08/23/14 0536  NA 140 141 140  K 4.1 4.0 3.9  CL 105 106 106  CO2 GLUCOSE 103* 95 78  BUN 31* 28* 19  CREATININE 0.62 0.54 0.53  CALCIUM 8.8 8.3* 8.4      PT/INR   Recent Labs  08/22/14 1502  LABPROT 13.6  INR 1.04    Serum iron 11, TIBC 280 and saturation 4%  Serum ferritin 12  Folate level greater than 20  B12 549   H. pylori serology is pending.   Assessment:  #1. Iron deficiency anemia secondary to GI blood loss. She presented with hemoglobin of 6 g and history of melena and no lesion found on EGD to account for GI bleed. No blood in his greater than 10 g with 3 units of PRBCs. Patient will need colonoscopy and she is agreeable. I also talk with her sister-in-law Kendal Hymen yesterday. #2. Memory impairment.  Recommendations;  GoLYTELY prep today. Colonoscopy on 08/24/2014.

## 2014-08-23 NOTE — Progress Notes (Signed)
Patient confused and unable to sign own consent. Patients brother Melanie Holland called and he will come to hospital to sign consent.

## 2014-08-23 NOTE — Progress Notes (Signed)
Patient is slowly drinking nulytely. Frequently encouraging patient to drink. Patient is having bowel movements now. Brother and sister in law at bedside. Consent for colonoscopy signed by patients brother due to patients continued confusion.

## 2014-08-23 NOTE — Progress Notes (Signed)
Patient ID: Melanie Holland  female  WUJ:[id number]    DOB: 1932-09-19    DOA: 08/21/2014  PCP: Quinn Axe, PA-C  Assessment/Plan: Principal Problem: Acute on chronic Severe  Anemia likely due to GI bleed, patient reports melanotic stools for a month - Hemoglobin 10.8 after 2 units packed RBC transfusion, fobt still pending - EGD 8/30 showed small sliding hiatal hernia with gastroduodenitis but no bleeding lesion - Discussed with Dr Karilyn Cota, continue clears, IV PPI, plan on colonoscopy 9/1   Active Problems:   Melena - As #1    Asthma, chronic - Currently stable   Dementia: The patient appears to have some mild cognitive deficits, not new per patient's brother - B12, folate within normal range, will check TSH, RPR for dementia workup, further evaluation outpatient by her PCP  DVT Prophylaxis: SCDs   Code Status: full code   Family Communication: called patient's brother, Niamh Rada, he confirmed that patient does have mild cognitive deficit/dementia   Disposition:  Consultants:  Gastroenterology   Procedures: EGD  Impression:  Small sliding hiatal hernia.  Gastroduodenitis.  No bleeding lesion identified.   Antibiotics:  None     Subjective: Patient seen and examined, not active GI bleeding, no abdominal pain, no nausea, vomiting  Objective: Weight change:   Intake/Output Summary (Last 24 hours) at 08/23/14 0854 Last data filed at 08/22/14 1800  Gross per 24 hour  Intake   1110 ml  Output      0 ml  Net   1110 ml   Blood pressure 134/64, pulse 75, temperature 98.5 F (36.9 C), temperature source Oral, resp. rate 20, height 5' 2.5" (1.588 m), weight 43.545 kg (96 lb), SpO2 97.00%.  Physical Exam: General: Alert and awake, oriented to self and place, NAD. CVS: S1-S2 clear, no murmur rubs or gallops Chest: ctab Abdomen: soft nontender, nondistended, normal bowel sounds  Extremities: no cyanosis, clubbing or edema noted  bilaterally   Lab Results: Basic Metabolic Panel:  Recent Labs Lab 08/22/14 0656 08/23/14 0536  NA 141 140  K 4.0 3.9  CL 106 106  CO2 25 23  GLUCOSE 95 78  BUN 28* 19  CREATININE 0.54 0.53  CALCIUM 8.3* 8.4   Liver Function Tests:  Recent Labs Lab 08/21/14 2100 08/22/14 0656  AST 17 19  ALT 11 12  ALKPHOS 53 50  BILITOT 0.2* 0.2*  PROT 5.6* 5.1*  ALBUMIN 3.2* 2.9*   No results found for this basename: LIPASE, AMYLASE,  in the last 168 hours No results found for this basename: AMMONIA,  in the last 168 hours CBC:  Recent Labs Lab 08/21/14 2100 08/22/14 0656  08/23/14 0120 08/23/14 0536  WBC 4.6 4.9  --   --  7.4  NEUTROABS 3.0  --   --   --   --   HGB 6.0* 5.9*  < > 10.3* 10.8*  HCT 18.9* 18.2*  < > 31.0* 32.2*  MCV 94.0 93.8  --   --  86.3  PLT 233 229  --   --  214  < > = values in this interval not displayed. Cardiac Enzymes: No results found for this basename: CKTOTAL, CKMB, CKMBINDEX, TROPONINI,  in the last 168 hours BNP: No components found with this basename: POCBNP,  CBG: No results found for this basename: GLUCAP,  in the last 168 hours   Micro Results: No results found for this or any previous visit (from the past 240 hour(s)).  Studies/Results: Dg Abd 1  View  08/20/2014   CLINICAL DATA:  Tenderness.  Nausea and vomiting .  EXAM: ABDOMEN - 1 VIEW  COMPARISON:  None.  FINDINGS: Soft tissue structures unremarkable. Gas pattern nonspecific. Rounded prominent calcific density in the pelvis. This could represent calcified fibroid or dermoid. Punctate calcification lower pelvis consistent with phlebolith. Degenerative changes lumbar spine and both hips. No acute bony abnormality.  IMPRESSION: 1. No acute abnormality.  No bowel distention. 2. Coarse calcification pelvis most likely calcified fibroid or dermoid.   Electronically Signed   By: Maisie Fus  Register   On: 08/20/2014 17:23   Dg Chest Portable 1 View  08/21/2014   CLINICAL DATA:  Weakness and  shortness of breath.  EXAM: PORTABLE CHEST - 1 VIEW  COMPARISON:  12/01/2013  FINDINGS: Normal heart size and pulmonary vascularity. Emphysematous changes and scattered fibrosis in the lungs. No focal airspace disease or consolidation. No blunting of costophrenic angles. No pneumothorax. Calcification and torsion of the aorta.  IMPRESSION: Emphysematous changes in the lungs. No evidence of active pulmonary disease.   Electronically Signed   By: Burman Nieves M.D.   On: 08/21/2014 22:12    Medications: Scheduled Meds: . sodium chloride   Intravenous Once  . ipratropium  0.5 mg Nebulization QID  . mometasone-formoterol  2 puff Inhalation BID  . pantoprazole (PROTONIX) IV  40 mg Intravenous Q12H  . sodium chloride  3 mL Intravenous Q12H      LOS: 2 days   Willett Lefeber M.D. [facility] 08/23/2014, 8:54 AM Pager: [phone]  If 7PM-7AM, please contact night-coverage [email or web address] Password [email or web address]  **Disclaimer: This note was dictated with voice recognition software. Similar sounding words can inadvertently be transcribed and this note may contain transcription errors which may not have been corrected upon publication of note.**

## 2014-08-23 NOTE — Progress Notes (Addendum)
INITIAL NUTRITION ASSESSMENT  DOCUMENTATION CODES Per approved criteria  -Severe malnutrition in the context of chronic illness   Pt meets criteria for severe MALNUTRITION in the context of chronic illness as evidenced by severe fat and muscle depletion.  INTERVENTION: -RD will follow for diet advancement -Add ONS with diet advancement  NUTRITION DIAGNOSIS: Inadequate oral intake related to altered GI function as evidenced by NPO.   Goal: Pt will meet >90% of estimated nutritional needs  Monitor:  Diet advancement, PO/supplement intake, labs, weight changes, I/O's  Reason for Assessment: MST=2  80 y.o. female  Admitting Dx: Anemia  80 year old female with a history of asthma who came to the ED with chief complaint of generalized weakness and black stools. Patient says that she has been having these stools over the past one month and has noticed worsening fatigue and weakness. She admits to having some shortness of breath on exertion and dizziness on walking but denies passing out. Patient denies taking NSAIDs including aspirin or Motrin for pain she takes acetaminophen for pain.  She denies any vomiting, but admits to having some nausea.  She denies chest pain , no fever or dysuria. She denies diarrhea.  In the ED patient found to have guaiac-positive stools, with anemia with hemoglobin of 6.0. 1 unit of blood transfusion has been ordered by the ED physician.  ASSESSMENT: Pt reports good appetite PTA. She reports she feels like she got admitted to the hospital because she was not watching her diet as she should ("I have diverticulitis and I was eating too many strawberries and peppercorns. I should have known better"). She reveals that she has lost weight in the past; her lowest weight was 90# a few months ago, which she attributed to illness. She has slowly been gaining weight back by eating increased portions, but revealed fear of becoming over weight ("I only wanna get to  110-115#").  She denies any difficulty chewing or swallowing foods, but reports meats tend to get stuck in her teeth ("I just floss it out"). She declined offer of a mechanically altered diet.  Diet recall reveals: Breakfast: scrambled eggs and whole wheat toast; Lunch and dinner:  meat (beef, pork chop OR chicken tenders), with a wide variety of vegetables found in her garden (asparagus, squash, peas, greens). She reports drinking a lot of skim milk.  Educated pt on importance of good PO intake to support healing process. Discussed ways to add protein and calories in her diet to preserve muscle mass and promote weight gain. Pt very appreciative for RD visit.  Received blood transfusion on 08/22/14. S/p EGD on 08/22/14 which revealed small sliding hiatal hernia and gastroduodenitis. Plan for colonoscopy on 08/24/14. Labs reviewed. All WDL.   Nutrition Focused Physical Exam:  Subcutaneous Fat:  Orbital Region: moderate depletion Upper Arm Region: severe depletion Thoracic and Lumbar Region: severe depletion  Muscle:  Temple Region: moderate depletion Clavicle Bone Region: severe depeltion Clavicle and Acromion Bone Region: severe depletion Scapular Bone Region: severe depletion Dorsal Hand: severe depeltion Patellar Region: severe depletion Anterior Thigh Region: severe depletion Posterior Calf Region: moderate depletion  Edema: none present  Height: Ht Readings from Last 1 Encounters:  08/21/14 5' 2.5" (1.588 m)    Weight: Wt Readings from Last 1 Encounters:  08/22/14 96 lb (43.545 kg)    Ideal Body Weight: 113#  % Ideal Body Weight: 85%  Wt Readings from Last 10 Encounters:  08/22/14 96 lb (43.545 kg)  08/22/14 96 lb (43.545 kg)  08/22/14  96 lb (43.545 kg)    Usual Body Weight: 110#  % Usual Body Weight: 85%  BMI:  Body mass index is 17.27 kg/(m^2). Underweight  Estimated Nutritional Needs: Kcal: 1610-9604 Protein: 54-64 grams Fluid: 1.3-1.5 L  Skin:  Intact  Diet Order: NPO  EDUCATION NEEDS: -Education needs addressed   Intake/Output Summary (Last 24 hours) at 08/23/14 0951 Last data filed at 08/22/14 1800  Gross per 24 hour  Intake    985 ml  Output      0 ml  Net    985 ml    Last BM: 08/21/14  Labs:   Recent Labs Lab 08/21/14 2100 08/22/14 0656 08/23/14 0536  NA 140 141 140  K 4.1 4.0 3.9  CL 105 106 106  CO2 BUN 31* 28* 19  CREATININE 0.62 0.54 0.53  CALCIUM 8.8 8.3* 8.4  GLUCOSE 103* 95 78    CBG (last 3)  No results found for this basename: GLUCAP,  in the last 72 hours  Scheduled Meds: . sodium chloride   Intravenous Once  . ipratropium  0.5 mg Nebulization QID  . mometasone-formoterol  2 puff Inhalation BID  . pantoprazole (PROTONIX) IV  40 mg Intravenous Q12H  . sodium chloride  3 mL Intravenous Q12H    Continuous Infusions: . sodium chloride      Past Medical History  Diagnosis Date  . Asthma     Past Surgical History  Procedure Laterality Date  . Abdominal hysterectomy    . Tonsillectomy    . Appendectomy      Nicklas Mcsweeney A. Mayford Knife, RD, LDN Pager: [phone]

## 2014-08-24 ENCOUNTER — Encounter (HOSPITAL_COMMUNITY)
Admission: EM | Disposition: A | Discharge status: Home / Self Care | Payer: Self-pay | Source: Home / Self Care | Attending: Internal Medicine

## 2014-08-24 ENCOUNTER — Encounter (HOSPITAL_COMMUNITY): Payer: Self-pay | Admitting: *Deleted

## 2014-08-24 DIAGNOSIS — E43 Unspecified severe protein-calorie malnutrition: Secondary | ICD-10-CM

## 2014-08-24 DIAGNOSIS — K5730 Diverticulosis of large intestine without perforation or abscess without bleeding: Secondary | ICD-10-CM

## 2014-08-24 DIAGNOSIS — K922 Gastrointestinal hemorrhage, unspecified: Secondary | ICD-10-CM

## 2014-08-24 DIAGNOSIS — K644 Residual hemorrhoidal skin tags: Secondary | ICD-10-CM

## 2014-08-24 DIAGNOSIS — D649 Anemia, unspecified: Secondary | ICD-10-CM

## 2014-08-24 HISTORY — PX: COLONOSCOPY: SHX5424

## 2014-08-24 HISTORY — DX: Gastrointestinal hemorrhage, unspecified: K92.2

## 2014-08-24 LAB — CBC
HEMATOCRIT: 32.4 % — AB (ref 36.0–46.0)
HEMOGLOBIN: 10.9 g/dL — AB (ref 12.0–15.0)
MCH: 29.1 pg (ref 26.0–34.0)
MCHC: 33.6 g/dL (ref 30.0–36.0)
MCV: 86.4 fL (ref 78.0–100.0)
Platelets: 229 10*3/uL (ref 150–400)
RBC: 3.75 MIL/uL — ABNORMAL LOW (ref 3.87–5.11)
RDW: 17.1 % — ABNORMAL HIGH (ref 11.5–15.5)
WBC: 5.2 10*3/uL (ref 4.0–10.5)

## 2014-08-24 LAB — BASIC METABOLIC PANEL
ANION GAP: 8 (ref 5–15)
BUN: 7 mg/dL (ref 6–23)
CALCIUM: 8.5 mg/dL (ref 8.4–10.5)
CHLORIDE: 105 meq/L (ref 96–112)
CO2: 26 mEq/L (ref 19–32)
Creatinine, Ser: 0.55 mg/dL (ref 0.50–1.10)
GFR calc Af Amer: >90 mL/min (ref >90)
GFR calc non Af Amer: 87 mL/min — ABNORMAL LOW (ref >90)
GLUCOSE: 83 mg/dL (ref 70–99)
Potassium: 3.4 mEq/L — ABNORMAL LOW (ref 3.7–5.3)
Sodium: 139 mEq/L (ref 137–147)

## 2014-08-24 SURGERY — COLONOSCOPY
Anesthesia: Moderate Sedation

## 2014-08-24 MED ORDER — MIDAZOLAM HCL 5 MG/5ML IJ SOLN
INTRAMUSCULAR | Status: AC
  Filled 2014-08-24: qty 10

## 2014-08-24 MED ORDER — MEPERIDINE HCL 50 MG/ML IJ SOLN
INTRAMUSCULAR | Status: AC
  Filled 2014-08-24: qty 1

## 2014-08-24 MED ORDER — MEPERIDINE HCL 50 MG/ML IJ SOLN
INTRAMUSCULAR | Status: DC | PRN
  Administered 2014-08-24: 25 mg via INTRAVENOUS

## 2014-08-24 MED ORDER — MIDAZOLAM HCL 5 MG/5ML IJ SOLN
INTRAMUSCULAR | Status: DC | PRN
  Administered 2014-08-24: 1 mg via INTRAVENOUS
  Administered 2014-08-24: 2 mg via INTRAVENOUS

## 2014-08-24 MED ORDER — PANTOPRAZOLE SODIUM 40 MG PO TBEC
40.0000 mg | DELAYED_RELEASE_TABLET | Freq: Every day | ORAL | Status: DC
  Administered 2014-08-25: 40 mg via ORAL
  Filled 2014-08-24: qty 1

## 2014-08-24 MED ORDER — STERILE WATER FOR IRRIGATION IR SOLN
Status: DC | PRN
  Administered 2014-08-24: 12:00:00

## 2014-08-24 NOTE — Op Note (Signed)
COLONOSCOPY PROCEDURE REPORT  PATIENT:  Melanie Holland  MR#:  [id number] Birthdate:  17-Jun-1932, 80 y.o., female Endoscopist:  Dr. Malissa Hippo, MD Referred By:  Dr. Bonnetta Barry ref. provider found Procedure Date: 08/24/2014  Procedure:   Colonoscopy  Indications:  Patient is 80 year old Caucasian female who presented with profound anemia and melena and underwent EGD two days ago and no bleeding lesion was identified. She has received 3 units of PRBCs and hemoglobin has increased from 6 g to 10.9. He is undergoing diagnostic colonoscopy. Procedure was explained to the patient informed consent was obtained from her brother [name].   Informed Consent:  The procedure and risks were reviewed with the patient and informed consent was obtained.  Medications:  Demerol 25 mg IV Versed 3 mg IV  Description of procedure:  After a digital rectal exam was performed, that colonoscope was advanced from the anus through the rectum and colon to the area of the cecum, ileocecal valve and appendiceal orifice. The cecum was deeply intubated. These structures were well-seen and photographed for the record. From the level of the cecum and ileocecal valve, the scope was slowly and cautiously withdrawn. The mucosal surfaces were carefully surveyed utilizing scope tip to flexion to facilitate fold flattening as needed. The scope was pulled down into the rectum where a thorough exam including retroflexion was performed. Terminal ileum was also examined.  Findings:  Prep excellent. Normal mucosa of terminal ileum. Moderate number of medium size to large diverticula noted at sigmoid colon. Normal rectal mucosa. Prominent hemorrhoids below the dentate line.   Therapeutic/Diagnostic Maneuvers Performed:   None  Complications:  None  Cecal Withdrawal Time:  6 minutes  Impression:  Normal mucosa of terminal ileum. Moderate number of diverticula and sigmoid colon. External hemorrhoids.  Comment; Suspect she may  have bled from small bowel(NSAID enteropathy). No further workup unless bleeding recurs in which case she will need small bowel given capsule study.  Recommendations:  Standard instructions given. Talked with the patient's 2 brothers and advised that she should not take OTC NSAIDs.  REHMAN,NAJEEB U  08/24/2014 1:03 PM  CC: Dr. Quinn Axe, PA-C & Dr. Bonnetta Barry ref. provider found

## 2014-08-24 NOTE — Progress Notes (Signed)
Patient ID: Melanie Holland  female  WUJ:[id number]    DOB: 12-Mar-1932    DOA: 08/21/2014  PCP: Quinn Axe, PA-C  Assessment/Plan: Principal Problem: Acute on chronic Severe  Anemia likely due to GI bleed, patient reports melanotic stools for a month - Hemoglobin 10.9, s/p 2 units packed RBC transfusion on 8/30 - FOBT positive - EGD 8/30 showed small sliding hiatal hernia with gastroduodenitis but no bleeding lesion - Colonoscopy planned today, d/w Dr Karilyn Cota   Active Problems:   Melena - As #1    Asthma, chronic - Currently stable   Dementia: The patient appears to have some mild cognitive deficits, not new per patient's brother - B12, folate within normal range, TSH 1.0  - further evaluation outpatient by her PCP  DVT Prophylaxis: SCDs   Code Status: full code   Family Communication:  D/w patient's brother yesterday  Disposition:  Consultants:  Gastroenterology   Procedures: EGD  Impression:  Small sliding hiatal hernia.  Gastroduodenitis.  No bleeding lesion identified.   Antibiotics:  None     Subjective: Patient seen and examined, pleasantly confused otherwise no active GI bleeding, no abdominal pain, nausea and vomiting  Objective: Weight change:   Intake/Output Summary (Last 24 hours) at 08/24/14 0910 Last data filed at 08/23/14 1651  Gross per 24 hour  Intake    640 ml  Output   1150 ml  Net   -510 ml   Blood pressure 140/54, pulse 73, temperature 98.4 F (36.9 C), temperature source Oral, resp. rate 20, height 5' 2.5" (1.588 m), weight 43.545 kg (96 lb), SpO2 100.00%.  Physical Exam: General: Alert and awake, oriented to self and place, NAD. CVS: S1-S2 clear, no murmur rubs or gallops Chest: ctab Abdomen: soft, NT, ND, NBS Extremities: no c/c/e bilaterally   Lab Results: Basic Metabolic Panel:  Recent Labs Lab 08/23/14 0536 08/24/14 0645  NA 140 139  K 3.9 3.4*  CL 106 105  CO2 23 26  GLUCOSE 78 83  BUN 19 7    CREATININE 0.53 0.55  CALCIUM 8.4 8.5   Liver Function Tests:  Recent Labs Lab 08/21/14 2100 08/22/14 0656  AST 17 19  ALT 11 12  ALKPHOS 53 50  BILITOT 0.2* 0.2*  PROT 5.6* 5.1*  ALBUMIN 3.2* 2.9*   No results found for this basename: LIPASE, AMYLASE,  in the last 168 hours No results found for this basename: AMMONIA,  in the last 168 hours CBC:  Recent Labs Lab 08/21/14 2100  08/23/14 0536 08/24/14 0645  WBC 4.6  < > 7.4 5.2  NEUTROABS 3.0  --   --   --   HGB 6.0*  < > 10.8* 10.9*  HCT 18.9*  < > 32.2* 32.4*  MCV 94.0  < > 86.3 86.4  PLT 233  < > 214 229  < > = values in this interval not displayed. Cardiac Enzymes: No results found for this basename: CKTOTAL, CKMB, CKMBINDEX, TROPONINI,  in the last 168 hours BNP: No components found with this basename: POCBNP,  CBG: No results found for this basename: GLUCAP,  in the last 168 hours   Micro Results: No results found for this or any previous visit (from the past 240 hour(s)).  Studies/Results: Dg Abd 1 View  08/20/2014   CLINICAL DATA:  Tenderness.  Nausea and vomiting .  EXAM: ABDOMEN - 1 VIEW  COMPARISON:  None.  FINDINGS: Soft tissue structures unremarkable. Gas pattern nonspecific. Rounded prominent calcific density in  the pelvis. This could represent calcified fibroid or dermoid. Punctate calcification lower pelvis consistent with phlebolith. Degenerative changes lumbar spine and both hips. No acute bony abnormality.  IMPRESSION: 1. No acute abnormality.  No bowel distention. 2. Coarse calcification pelvis most likely calcified fibroid or dermoid.   Electronically Signed   By: Maisie Fus  Register   On: 08/20/2014 17:23   Dg Chest Portable 1 View  08/21/2014   CLINICAL DATA:  Weakness and shortness of breath.  EXAM: PORTABLE CHEST - 1 VIEW  COMPARISON:  12/01/2013  FINDINGS: Normal heart size and pulmonary vascularity. Emphysematous changes and scattered fibrosis in the lungs. No focal airspace disease or  consolidation. No blunting of costophrenic angles. No pneumothorax. Calcification and torsion of the aorta.  IMPRESSION: Emphysematous changes in the lungs. No evidence of active pulmonary disease.   Electronically Signed   By: Burman Nieves M.D.   On: 08/21/2014 22:12    Medications: Scheduled Meds: . sodium chloride   Intravenous Once  . ipratropium  0.5 mg Nebulization QID  . mometasone-formoterol  2 puff Inhalation BID  . pantoprazole (PROTONIX) IV  40 mg Intravenous Q12H  . sodium chloride  3 mL Intravenous Q12H      LOS: 3 days   RAI,RIPUDEEP M.D. [facility] 08/24/2014, 9:10 AM Pager: [phone]  If 7PM-7AM, please contact night-coverage [email or web address] Password [email or web address]  **Disclaimer: This note was dictated with voice recognition software. Similar sounding words can inadvertently be transcribed and this note may contain transcription errors which may not have been corrected upon publication of note.**

## 2014-08-24 NOTE — Progress Notes (Signed)
Patient returned from Endo drowsy but arousable. Vital signs stable.

## 2014-08-25 LAB — BASIC METABOLIC PANEL
Anion gap: 10 (ref 5–15)
BUN: 9 mg/dL (ref 6–23)
CO2: 24 mEq/L (ref 19–32)
CREATININE: 0.58 mg/dL (ref 0.50–1.10)
Calcium: 8.8 mg/dL (ref 8.4–10.5)
Chloride: 106 mEq/L (ref 96–112)
GFR calc Af Amer: >90 mL/min (ref >90)
GFR calc non Af Amer: 85 mL/min — ABNORMAL LOW (ref >90)
Glucose, Bld: 76 mg/dL (ref 70–99)
Potassium: 3.8 mEq/L (ref 3.7–5.3)
Sodium: 140 mEq/L (ref 137–147)

## 2014-08-25 LAB — HEMOGLOBIN AND HEMATOCRIT, BLOOD
HEMATOCRIT: 32.1 % — AB (ref 36.0–46.0)
Hemoglobin: 10.9 g/dL — ABNORMAL LOW (ref 12.0–15.0)

## 2014-08-25 LAB — TYPE AND SCREEN
ABO/RH(D): O POS
Antibody Screen: POSITIVE
DAT, IGG: NEGATIVE
DONOR AG TYPE: NEGATIVE
Donor AG Type: NEGATIVE
Donor AG Type: NEGATIVE
Donor AG Type: NEGATIVE
PT AG TYPE: NEGATIVE
UNIT DIVISION: 0
UNIT DIVISION: 0
UNIT DIVISION: 0
Unit division: 0

## 2014-08-25 NOTE — Discharge Summary (Addendum)
Physician Discharge Summary  Liisa Picone ZOX:[id number] DOB: September 11, 1932 DOA: 08/21/2014  PCP: Quinn Axe, PA-C  Admit date: 08/21/2014 Discharge date: 08/25/2014  Recommendations for Outpatient Follow-up:  1. Recommend following CBC, assess for rebleeding. 2. If the patient has further bleeding, consider Givens capsule study.  3. Recommend smoking cessation. 4. No NSAIDs. 5. Severe malnutrition    Follow-up Information   Follow up with ROBERTSON, ANTHONY T, PA-C. Schedule an appointment as soon as possible for a visit in 1 week.   Specialty:  Physician Assistant   Contact information:   [address] [place] [address] [phone]      Discharge Diagnoses:  1. Melena, GI bleed, small bowel NSAID-enteropathy suspected 2. Symptomatic iron deficiency anemia secondary to GI blood loss possible acute blood loss anemia versus acute 3. Dyspnea on exertion, generalized weakness secondary to anemia 4. Tobacco dependence 5. Mild dementia 6. Severe malnutrition  Discharge Condition: Improved Disposition: Home   Diet recommendation: Regular  Filed Weights   08/21/14 2031 08/21/14 2325 08/22/14 0603  Weight: 41.731 kg (92 lb) 43.545 kg (96 lb) 43.545 kg (96 lb)    History of present illness:  80 year old woman presented to the emergency department with one month history of dark stools with progressive fatigue and generalized weakness as well as dyspnea on exertion. Initial evaluation revealed profound anemia and guaiac positive stool.   Hospital Course:  Ms. Melanie Holland was admitted for further evaluation of melena. She was transfused with appropriate response in hemoglobin and had no evidence of recurrent bleeding. Hemoglobin remained stable. She was seen by GI, underwent upper and lower endoscopy which was unrevealing. NSAID-related enteropathy suspected. If she has recurrent bleeding, Givens capsule study small bowel recommended. I discussed the case today  with Dr. Karilyn Cota and she has been cleared for discharge home. Discussed with her son Annette Stable by telephone.  1. Melena, GIB, small bowel suspected. EGD and colonoscopy unrevealing. NSAID-enteropathy suspected per gastroenterology. No evidence of bleeding during this hospitalization. 2. Symptomatic iron-deficiency anemia secondary to GI blood loss, possible ABLA vs. Subacute. Hgb stable since 8/30. 3. Dyspnea on exertion, generalized weakness secondary to anemia 4. H/o asthma 5. Tobacco dependence  6. Possible mild dementia  7. Severe malnutrition  No further evaluation planned unless bleeding recurs, in which case small bowel Given capsule study recommended  No NSAIDs   Consultants:  Gastroenterology  Procedures:  8/30 Transfusion 2 units packed red blood cells  8/30 EGD Impression:  Small sliding hiatal hernia.  Gastroduodenitis.  No bleeding lesion identified.  9/1 colonoscopy  Impression:  Normal mucosa of terminal ileum.  Moderate number of diverticula and sigmoid colon.  External hemorrhoids.  Discharge Instructions  Discharge Instructions   Diet general    Complete by:  As directed      Discharge instructions    Complete by:  As directed   No NSAIDs (including no ibuprofen, Advil, Motrin, Aleve, Naprosyn, aspirin, Goody/BC powders). Any of these medications may cause bleeding. Call your physician or seek immediate medical attention for bleeding, pain or worsening of condition.          Current Discharge Medication List    CONTINUE these medications which have NOT CHANGED   Details  Esomeprazole Magnesium (NEXIUM PO) Take 22.3 mg by mouth daily.    ferrous sulfate 325 (65 FE) MG tablet Take 325 mg by mouth daily with breakfast.    Fluticasone-Salmeterol (ADVAIR) 250-50 MCG/DOSE AEPB Inhale 1 puff into the lungs 2 (two) times daily.  ipratropium (ATROVENT HFA) 17 MCG/ACT inhaler Inhale 2 puffs into the lungs 4 (four) times daily.    Multiple Vitamins-Minerals  (COMPLETE WOMENS) TABS Take 1 tablet by mouth daily.       Allergies  Allergen Reactions  . Strawberry     The results of significant diagnostics from this hospitalization (including imaging, microbiology, ancillary and laboratory) are listed below for reference.    Significant Diagnostic Studies: Dg Abd 1 View  08/20/2014   CLINICAL DATA:  Tenderness.  Nausea and vomiting .  EXAM: ABDOMEN - 1 VIEW  COMPARISON:  None.  FINDINGS: Soft tissue structures unremarkable. Gas pattern nonspecific. Rounded prominent calcific density in the pelvis. This could represent calcified fibroid or dermoid. Punctate calcification lower pelvis consistent with phlebolith. Degenerative changes lumbar spine and both hips. No acute bony abnormality.  IMPRESSION: 1. No acute abnormality.  No bowel distention. 2. Coarse calcification pelvis most likely calcified fibroid or dermoid.   Electronically Signed   By: Maisie Fus  Register   On: 08/20/2014 17:23   Dg Chest Portable 1 View  08/21/2014   CLINICAL DATA:  Weakness and shortness of breath.  EXAM: PORTABLE CHEST - 1 VIEW  COMPARISON:  12/01/2013  FINDINGS: Normal heart size and pulmonary vascularity. Emphysematous changes and scattered fibrosis in the lungs. No focal airspace disease or consolidation. No blunting of costophrenic angles. No pneumothorax. Calcification and torsion of the aorta.  IMPRESSION: Emphysematous changes in the lungs. No evidence of active pulmonary disease.   Electronically Signed   By: Burman Nieves M.D.   On: 08/21/2014 22:12     Labs: Basic Metabolic Panel:  Recent Labs Lab 08/21/14 2100 08/22/14 0656 08/23/14 0536 08/24/14 0645 08/25/14 0538  NA 140 141 140 139 140  K 4.1 4.0 3.9 3.4* 3.8  CL 105 106 106 105 106  CO2 GLUCOSE 103* 95 78 83 76  BUN 31* 28* CREATININE 0.62 0.54 0.53 0.55 0.58  CALCIUM 8.8 8.3* 8.4 8.5 8.8   Liver Function Tests:  Recent Labs Lab 08/21/14 2100 08/22/14 0656  AST 17  19  ALT 11 12  ALKPHOS 53 50  BILITOT 0.2* 0.2*  PROT 5.6* 5.1*  ALBUMIN 3.2* 2.9*   CBC:  Recent Labs Lab 08/21/14 2100 08/22/14 0656 08/22/14 1502 08/23/14 0120 08/23/14 0536 08/24/14 0645 08/25/14 0538  WBC 4.6 4.9  --   --  7.4 5.2  --   NEUTROABS 3.0  --   --   --   --   --   --   HGB 6.0* 5.9* 9.2* 10.3* 10.8* 10.9* 10.9*  HCT 18.9* 18.2* 27.6* 31.0* 32.2* 32.4* 32.1*  MCV 94.0 93.8  --   --  86.3 86.4  --   PLT 233 229  --   --  214 229  --     Principal Problem:   Anemia Active Problems:   Melena   Asthma, chronic   Protein-calorie malnutrition, severe   Time coordinating discharge: 35 minutes  Signed:  Brendia Sacks, MD [facility] 08/25/2014, 4:34 PM

## 2014-08-25 NOTE — Progress Notes (Signed)
PROGRESS NOTE  Melanie Holland [id number]:[id number] DOB: [birth year] DOA: 08/21/2014 PCP: Quinn Axe, PA-C  Summary: 80 year old woman presented to the emergency department with one month history of dark stools with progressive fatigue and generalized weakness as well as dyspnea on exertion. Initial evaluation revealed profound anemia and guaiac positive stool. She was admitted for further evaluation of melena. She was transfused with appropriate response in hemoglobin and had no evidence of recurrent bleeding. Hemoglobin has remained stable. She was seen by GI, underwent upper and lower endoscopy which was unrevealing. NSAID-related enteropathy suspected. If she has recurrent bleeding, Givens capsule study small bowel recommended.   Assessment/Plan: 1. Melena, GIB, small bowel suspected. EGD and colonoscopy unrevealing. NSAID-enteropathy suspected per gastroenterology. No evidence of bleeding during this hospitalization. 2. Symptomatic iron-deficiency anemia secondary to GI blood loss, possible ABLA vs. Subacute. Hgb stable since 8/30. 3. Dyspnea on exertion, generalized weakness secondary to anemia 4. H/o asthma 5. Tobacco dependence  6. Possible mild dementia  7. Severe malnutrition    No further evaluation planned unless bleeding recurs, in which case small bowel Given capsule study recommended  No NSAIDs  Remove telemetry  Likely home later today if cleared by GI  Brendia Sacks, MD  [facility]  Pager [phone] If 7PM-7AM, please contact night-coverage at [email or web address], password [email or web address] 08/25/2014, 12:01 PM  LOS: 4 days   Consultants:  Gastroenterology   Procedures:  8/30 Transfusion 2 units packed red blood cells  8/30 EGD Impression:  Small sliding hiatal hernia.  Gastroduodenitis.  No bleeding lesion identified.  9/1 colonoscopy  Impression:  Normal mucosa of terminal ileum.  Moderate number of diverticula and sigmoid colon.  External  hemorrhoids.  HPI/Subjective: No complaints, feeling better. No pain, tolerating diet.   Objective: Filed Vitals:   08/24/14 2001 08/25/14 0611 08/25/14 0731 08/25/14 1118  BP: 111/56 116/58    Pulse: 79 70    Temp: 98.3 F (36.8 C) 98.5 F (36.9 C)    TempSrc: Oral Oral    Resp: 18 18    Height:      Weight:      SpO2: 98% 98% 97% 98%    Intake/Output Summary (Last 24 hours) at 08/25/14 1201 Last data filed at 08/25/14 0800  Gross per 24 hour  Intake    480 ml  Output      0 ml  Net    480 ml     Filed Weights   08/21/14 2031 08/21/14 2325 08/22/14 0603  Weight: 41.731 kg (92 lb) 43.545 kg (96 lb) 43.545 kg (96 lb)    Exam:     Afebrile, vital signs stable. Hypoxia. Gen. Appears calm, comfortable. Able to sit up on side of bed without assistance  Psych. Alert. Speech fluent and appropriate  Cardiovascular. Regular rate and rhythm. No murmur, rub or gallop. Telemetry SR, no arrhythmias.   Respiratory. Clear to auscultation bilaterally. No wheezes, rales or rhonchi. Normal respiratory effort.  Abdomen. Soft, nontender, nondistended.  Data Reviewed:  Chemistry: Basic metabolic panel unremarkable  Heme: Hemoglobin stable 10.9  Scheduled Meds: . sodium chloride   Intravenous Once  . ipratropium  0.5 mg Nebulization QID  . mometasone-formoterol  2 puff Inhalation BID  . pantoprazole  40 mg Oral QAC breakfast  . sodium chloride  3 mL Intravenous Q12H   Continuous Infusions:   Principal Problem:   Anemia Active Problems:   Melena   Asthma, chronic   Protein-calorie malnutrition, severe

## 2014-08-25 NOTE — Progress Notes (Signed)
Patient's IVs were removed and were clean, dry, and intact at removal.  Patient and patient's brother received discharge instructions and was able to teach back about diet, not taking NSAIDs, and follow-up appointments.  Patient had no further questions/concerns.  Patient was escorted to vehicle via wheelchair by nurse tech.

## 2014-08-27 ENCOUNTER — Encounter (HOSPITAL_COMMUNITY): Payer: Self-pay | Admitting: Internal Medicine

## 2014-10-01 ENCOUNTER — Emergency Department (HOSPITAL_COMMUNITY): Payer: Medicare Other

## 2014-10-01 ENCOUNTER — Encounter (HOSPITAL_COMMUNITY): Payer: Self-pay | Admitting: Emergency Medicine

## 2014-10-01 ENCOUNTER — Observation Stay (HOSPITAL_COMMUNITY)
Admission: EM | Admit: 2014-10-01 | Discharge: 2014-10-03 | Disposition: A | Discharge status: Home / Self Care | Payer: Medicare Other | Attending: Internal Medicine | Admitting: Internal Medicine

## 2014-10-01 DIAGNOSIS — J45909 Unspecified asthma, uncomplicated: Secondary | ICD-10-CM | POA: Diagnosis not present

## 2014-10-01 DIAGNOSIS — Z720 Tobacco use: Secondary | ICD-10-CM | POA: Insufficient documentation

## 2014-10-01 DIAGNOSIS — F0390 Unspecified dementia without behavioral disturbance: Secondary | ICD-10-CM | POA: Insufficient documentation

## 2014-10-01 DIAGNOSIS — Z7952 Long term (current) use of systemic steroids: Secondary | ICD-10-CM | POA: Diagnosis not present

## 2014-10-01 DIAGNOSIS — K921 Melena: Secondary | ICD-10-CM | POA: Diagnosis present

## 2014-10-01 DIAGNOSIS — R55 Syncope and collapse: Principal | ICD-10-CM | POA: Diagnosis present

## 2014-10-01 DIAGNOSIS — E46 Unspecified protein-calorie malnutrition: Secondary | ICD-10-CM | POA: Diagnosis not present

## 2014-10-01 DIAGNOSIS — D649 Anemia, unspecified: Secondary | ICD-10-CM | POA: Insufficient documentation

## 2014-10-01 DIAGNOSIS — K922 Gastrointestinal hemorrhage, unspecified: Secondary | ICD-10-CM | POA: Diagnosis not present

## 2014-10-01 DIAGNOSIS — J4530 Mild persistent asthma, uncomplicated: Secondary | ICD-10-CM

## 2014-10-01 DIAGNOSIS — Z79899 Other long term (current) drug therapy: Secondary | ICD-10-CM | POA: Diagnosis not present

## 2014-10-01 DIAGNOSIS — R4020 Unspecified coma: Secondary | ICD-10-CM

## 2014-10-01 HISTORY — DX: Anemia, unspecified: D64.9

## 2014-10-01 HISTORY — DX: Unspecified dementia, mild, without behavioral disturbance, psychotic disturbance, mood disturbance, and anxiety: F03.A0

## 2014-10-01 HISTORY — DX: Unspecified protein-calorie malnutrition: E46

## 2014-10-01 HISTORY — DX: Unspecified dementia without behavioral disturbance: F03.90

## 2014-10-01 HISTORY — DX: Gastrointestinal hemorrhage, unspecified: K92.2

## 2014-10-01 LAB — URINALYSIS, ROUTINE W REFLEX MICROSCOPIC
BILIRUBIN URINE: NEGATIVE
Glucose, UA: NEGATIVE mg/dL
Hgb urine dipstick: NEGATIVE
Leukocytes, UA: NEGATIVE
Nitrite: NEGATIVE
PH: 6 (ref 5.0–8.0)
Protein, ur: NEGATIVE mg/dL
Specific Gravity, Urine: 1.015 (ref 1.005–1.030)
Urobilinogen, UA: 0.2 mg/dL (ref 0.0–1.0)

## 2014-10-01 LAB — BASIC METABOLIC PANEL
Anion gap: 14 (ref 5–15)
BUN: 15 mg/dL (ref 6–23)
CALCIUM: 9.5 mg/dL (ref 8.4–10.5)
CO2: 25 mEq/L (ref 19–32)
CREATININE: 0.54 mg/dL (ref 0.50–1.10)
Chloride: 98 mEq/L (ref 96–112)
GFR calc Af Amer: >90 mL/min (ref >90)
GFR, EST NON AFRICAN AMERICAN: 87 mL/min — AB (ref >90)
GLUCOSE: 98 mg/dL (ref 70–99)
Potassium: 4 mEq/L (ref 3.7–5.3)
Sodium: 137 mEq/L (ref 137–147)

## 2014-10-01 LAB — CBC WITH DIFFERENTIAL/PLATELET
Basophils Absolute: 0 10*3/uL (ref 0.0–0.1)
Basophils Relative: 0 % (ref 0–1)
EOS ABS: 0.1 10*3/uL (ref 0.0–0.7)
Eosinophils Relative: 1 % (ref 0–5)
HCT: 46.5 % — ABNORMAL HIGH (ref 36.0–46.0)
HEMOGLOBIN: 15.5 g/dL — AB (ref 12.0–15.0)
LYMPHS ABS: 0.9 10*3/uL (ref 0.7–4.0)
Lymphocytes Relative: 14 % (ref 12–46)
MCH: 29.4 pg (ref 26.0–34.0)
MCHC: 33.3 g/dL (ref 30.0–36.0)
MCV: 88.1 fL (ref 78.0–100.0)
MONO ABS: 0.5 10*3/uL (ref 0.1–1.0)
MONOS PCT: 8 % (ref 3–12)
Neutro Abs: 4.8 10*3/uL (ref 1.7–7.7)
Neutrophils Relative %: 77 % (ref 43–77)
Platelets: 266 10*3/uL (ref 150–400)
RBC: 5.28 MIL/uL — AB (ref 3.87–5.11)
RDW: 15.5 % (ref 11.5–15.5)
WBC: 6.2 10*3/uL (ref 4.0–10.5)

## 2014-10-01 LAB — TROPONIN I: Troponin I: <0.3 ng/mL (ref <0.30)

## 2014-10-01 LAB — LACTIC ACID, PLASMA: Lactic Acid, Venous: 1.2 mmol/L (ref 0.5–2.2)

## 2014-10-01 MED ORDER — ACETAMINOPHEN 650 MG RE SUPP: 650.0000 mg | Freq: Four times a day (QID) | RECTAL | Status: DC | PRN

## 2014-10-01 MED ORDER — ENOXAPARIN SODIUM 40 MG/0.4ML ~~LOC~~ SOLN
40.0000 mg | SUBCUTANEOUS | Status: DC
  Administered 2014-10-02: 40 mg via SUBCUTANEOUS
  Filled 2014-10-01: qty 0.4

## 2014-10-01 MED ORDER — MOMETASONE FURO-FORMOTEROL FUM 100-5 MCG/ACT IN AERO
2.0000 | INHALATION_SPRAY | Freq: Two times a day (BID) | RESPIRATORY_TRACT | Status: DC
  Administered 2014-10-02 – 2014-10-03 (×4): 2 via RESPIRATORY_TRACT
  Filled 2014-10-01: qty 8.8

## 2014-10-01 MED ORDER — MOMETASONE FURO-FORMOTEROL FUM 100-5 MCG/ACT IN AERO
INHALATION_SPRAY | RESPIRATORY_TRACT | Status: AC
  Filled 2014-10-01: qty 8.8

## 2014-10-01 MED ORDER — SODIUM CHLORIDE 0.9 % IJ SOLN
3.0000 mL | Freq: Two times a day (BID) | INTRAMUSCULAR | Status: DC
  Administered 2014-10-02 – 2014-10-03 (×2): 3 mL via INTRAVENOUS

## 2014-10-01 MED ORDER — SODIUM CHLORIDE 0.9 % IV SOLN
INTRAVENOUS | Status: AC
  Administered 2014-10-01: 23:00:00 via INTRAVENOUS

## 2014-10-01 MED ORDER — ACETAMINOPHEN 325 MG PO TABS
650.0000 mg | ORAL_TABLET | Freq: Four times a day (QID) | ORAL | Status: DC | PRN
  Administered 2014-10-02: 650 mg via ORAL
  Filled 2014-10-01: qty 2

## 2014-10-01 MED ORDER — PANTOPRAZOLE SODIUM 40 MG PO TBEC
40.0000 mg | DELAYED_RELEASE_TABLET | Freq: Every day | ORAL | Status: DC
  Administered 2014-10-02 – 2014-10-03 (×2): 40 mg via ORAL
  Filled 2014-10-01 (×2): qty 1

## 2014-10-01 MED ORDER — IPRATROPIUM BROMIDE HFA 17 MCG/ACT IN AERS: 2.0000 | INHALATION_SPRAY | Freq: Four times a day (QID) | RESPIRATORY_TRACT | Status: DC

## 2014-10-01 MED ORDER — SENNOSIDES-DOCUSATE SODIUM 8.6-50 MG PO TABS: 1.0000 | ORAL_TABLET | Freq: Every evening | ORAL | Status: DC | PRN

## 2014-10-01 NOTE — H&P (Signed)
[facility] History and Physical  Melanie GladeWillie Comella ZOX:[id number] DOB: 1932/05/15 DOA: 10/01/2014  Referring physician: Samuel JesterMcManus, Kathleen, MD PCP: Quinn AxeOBERTSON, ANTHONY T, PA-C   Chief Complaint: Syncope  HPI: Melanie Holland is a 80 y.o. female with recent admission for GI bleed who presents today for recurrent syncope.  Pt states that this problem started about 1 1/2 to 2 weeks ago.  She says that she is having syncope episodes up to a few times per day, but typically not everyday.  She feels light headed prior to the syncope and knows she is about to pass out.  She lives with her brother, and states that he often will catch her and help her to the floor.  This patient as recently admitted for a GI bleed and acute blood loss anemia.  This problem has resolved.  She denies any seizure activity or post ictal state or loss of bowel or bladder continence with these syncopal episodes.  She has no significant cardiac history or history of seizures.  These episodes occur at home usually, and sometimes the patient is able to alleviate the symptoms by sitting down or laying down.  The syncope does not occur when she "stays in bed".  ED workup including EKG, CXR, CT head, orthostatic BP readings and labwork is largely WNL and unremarkable.  Hgb is 15.   Review of Systems:  Constitutional:  No weight loss, Fevers, chills, fatigue.  HEENT:  No headaches, nasal congestion, post nasal drip,  Cardio-vascular: + light headedness No chest pain, swelling in lower extremities, palpitations  GI:  No heartburn, abdominal pain, nausea, vomiting, diarrhea, change in bowel habits, loss of appetite  Resp:  No shortness of breath with exertion or at rest. No excess mucus, no cough, No wheezing.No chest wall deformity  Skin:  no rash or lesions.  GU:  no dysuria, change in color of urine, no urgency or frequency. No flank pain.  Musculoskeletal:  No joint pain or swelling. No decreased range of motion. No  back pain. Neuro: +Syncope, no seizure activity  Psych:  No change in mood or affect. No depression or anxiety. No memory loss.   Past Medical History  Diagnosis Date  . Asthma   . GI bleeding 08/2014  . Anemia   . Mild dementia   . Malnutrition    Past Surgical History  Procedure Laterality Date  . Abdominal hysterectomy    . Tonsillectomy    . Appendectomy    . Esophagogastroduodenoscopy N/A 08/22/2014    Procedure: ESOPHAGOGASTRODUODENOSCOPY (EGD);  Surgeon: Malissa HippoNajeeb U Rehman, MD;  Location: [facility];  Service: Endoscopy;  Laterality: N/A;  . Colonoscopy N/A 08/24/2014    Procedure: COLONOSCOPY;  Surgeon: Malissa HippoNajeeb U Rehman, MD;  Location: [facility];  Service: Endoscopy;  Laterality: N/A;   Social History:  reports that she has been smoking.  She does not have any smokeless tobacco history on file. She reports that she drinks alcohol. Her drug history is not on file.  Allergies  Allergen Reactions  . Strawberry     Family History  Problem Relation Age of Onset  . Heart failure Brother      Prior to Admission medications   Medication Sig Start Date End Date Taking? Authorizing Provider  Esomeprazole Magnesium (NEXIUM PO) Take 22.3 mg by mouth daily.   Yes Historical Provider, MD  ferrous sulfate 325 (65 FE) MG tablet Take 325 mg by mouth daily with breakfast.   Yes Historical Provider, MD  Fluticasone-Salmeterol (ADVAIR) 250-50  MCG/DOSE AEPB Inhale 1 puff into the lungs 2 (two) times daily.   Yes Historical Provider, MD  ipratropium (ATROVENT HFA) 17 MCG/ACT inhaler Inhale 2 puffs into the lungs 4 (four) times daily.   Yes Historical Provider, MD  Multiple Vitamins-Minerals (COMPLETE WOMENS) TABS Take 1 tablet by mouth daily.   Yes Historical Provider, MD   Physical Exam: Filed Vitals:   10/01/14 1747 10/01/14 1938  BP: 166/85 173/87  Pulse: 83 71  Temp: 97.7 F (36.5 C) 97.6 F (36.4 C)  TempSrc: Oral Oral  Resp: 20   Height: 5\' 2"  (1.575 m)   Weight: 40.824  kg (90 lb)   SpO2: 100% 100%    Wt Readings from Last 3 Encounters:  10/01/14 40.824 kg (90 lb)  08/22/14 43.545 kg (96 lb)  08/22/14 43.545 kg (96 lb)    General:  Appears calm and comfortable Eyes: PERRL, normal lids, irises & conjunctiva ENT: grossly normal hearing, lips & tongue Neck: no LAD, masses or thyromegaly Cardiovascular: regular rate, II/VI systolic murmur without r/g. No LE edema. Telemetry: SR, no arrhythmias  Respiratory: CTA bilaterally, no w/r/r. Normal respiratory effort. Abdomen: soft, ntnd Skin: no rash or induration seen on limited exam Musculoskeletal: grossly normal tone BUE/BLE Psychiatric: grossly normal mood and affect, speech fluent and appropriate Neurologic: grossly non-focal.          Labs on Admission:  Basic Metabolic Panel:  Recent Labs Lab 10/01/14 1820  NA 137  K 4.0  CL 98  CO2 25  GLUCOSE 98  BUN 15  CREATININE 0.54  CALCIUM 9.5   Liver Function Tests: No results found for this basename: AST, ALT, ALKPHOS, BILITOT, PROT, ALBUMIN,  in the last 168 hours No results found for this basename: LIPASE, AMYLASE,  in the last 168 hours No results found for this basename: AMMONIA,  in the last 168 hours CBC:  Recent Labs Lab 10/01/14 1820  WBC 6.2  NEUTROABS 4.8  HGB 15.5*  HCT 46.5*  MCV 88.1  PLT 266   Cardiac Enzymes:  Recent Labs Lab 10/01/14 1820  TROPONINI <0.30    BNP (last 3 results) No results found for this basename: PROBNP,  in the last 8760 hours CBG: No results found for this basename: GLUCAP,  in the last 168 hours  Radiological Exams on Admission: Dg Chest 1 View  10/01/2014   CLINICAL DATA:  Loss of consciousness x2 today  EXAM: CHEST - 1 VIEW  COMPARISON:  August 21, 2014  FINDINGS: The heart size and mediastinal contours are stable. The aorta is tortuous. There is no focal infiltrate, pulmonary edema, or pleural effusion. The visualized skeletal structures are stable.  IMPRESSION: No active  cardiopulmonary disease.   Electronically Signed   By: Sherian ReinWei-Chen  Lin M.D.   On: 10/01/2014 19:22   Ct Head Wo Contrast  10/01/2014   CLINICAL DATA:  Loss of consciousness, confusion  EXAM: CT HEAD WITHOUT CONTRAST  TECHNIQUE: Contiguous axial images were obtained from the base of the skull through the vertex without intravenous contrast.  COMPARISON:  None.  FINDINGS: No skull fracture is noted. No intracranial hemorrhage, mass effect or midline shift. No acute cortical infarction. No mass lesion is noted on this unenhanced scan. Moderate cerebral atrophy. Periventricular and patchy subcortical white matter decreased attenuation probable due to chronic small vessel ischemic changes. Atherosclerotic calcifications of carotid siphon.  IMPRESSION: No acute intracranial abnormality. Moderate cerebral atrophy. Periventricular and patchy subcortical white matter decreased attenuation probable due to chronic small  vessel ischemic changes pre   Electronically Signed   By: Natasha Mead M.D.   On: 10/01/2014 19:12    EKG: Independently reviewed. SR with old anteroseptal infarct  Assessment/Plan Principal Problem:   Syncope Active Problems:   Melena   Asthma, chronic   1. Syncope - work up so far neg.  Given her murmur will admit to obs telemetry to monitor for arrhythmia and order echocardiogram to rule out valvular stenosis 2. Melena - recent GI bleed resolved, continue pantoprazole 3. Asthma - continue home inhalers   Code Status: Full DVT Prophylaxis:lovenox Family Communication: Brother Disposition Plan: Home  Time spent: 57  Kristeen Miss [facility] Pager [phone]

## 2014-10-01 NOTE — ED Notes (Signed)
Pt c/o "passing out a lot recently". States she passed out twice today. Unsure if she feel, states she was sitting in a chair. Pt alert and oriented upon ED arrival. Denies pain anywhere. Nad noted.

## 2014-10-01 NOTE — ED Provider Notes (Signed)
CSN: [id number]     Arrival date & time 10/01/14  1738 History   First MD Initiated Contact with Patient 10/01/14 1738     Chief Complaint  Patient presents with  . Loss of Consciousness      HPI Pt was seen at 1750. Per pt, c/o sudden onset and resolution of multiple intermittent episodes of "passing out" for the past few weeks, worse over the past several days. Pt states she "passed out twice today;" once while walking, another while sitting in a chair. States she occasionally will feel lightheaded before having a syncopal episode. Denies CP/palpitations, no SOB/cough, no abd pain, no N/V/D, no fevers, no visual changes, no focal motor weakness, no tingling/numbness in extremities, no ataxia, no slurred speech, no facial droop.     Past Medical History  Diagnosis Date  . Asthma   . GI bleeding 08/2014  . Anemia   . Mild dementia   . Malnutrition    Past Surgical History  Procedure Laterality Date  . Abdominal hysterectomy    . Tonsillectomy    . Appendectomy    . Esophagogastroduodenoscopy N/A 08/22/2014    Procedure: ESOPHAGOGASTRODUODENOSCOPY (EGD);  Surgeon: Malissa HippoNajeeb U Rehman, MD;  [facility];  Service: Endoscopy;  Laterality: N/A;  . Colonoscopy N/A 08/24/2014    Procedure: COLONOSCOPY;  Surgeon: Malissa HippoNajeeb U Rehman, MD;  Location: [facility];  Service: Endoscopy;  Laterality: N/A;    History  Substance Use Topics  . Smoking status: Current Every Day Smoker  . Smokeless tobacco: Not on file  . Alcohol Use: Yes     Comment: occasionally    Review of Systems ROS: Statement: All systems negative except as marked or noted in the HPI; Constitutional: Negative for fever and chills. ; ; Eyes: Negative for eye pain, redness and discharge. ; ; ENMT: Negative for ear pain, hoarseness, nasal congestion, sinus pressure and sore throat. ; ; Cardiovascular: Negative for chest pain, palpitations, diaphoresis, dyspnea and peripheral edema. ; ; Respiratory: Negative for cough,  wheezing and stridor. ; ; Gastrointestinal: Negative for nausea, vomiting, diarrhea, abdominal pain, blood in stool, hematemesis, jaundice and rectal bleeding. . ; ; Genitourinary: Negative for dysuria, flank pain and hematuria. ; ; Musculoskeletal: Negative for back pain and neck pain. Negative for swelling and trauma.; ; Skin: Negative for pruritus, rash, abrasions, blisters, bruising and skin lesion.; ; Neuro: Negative for headache and neck stiffness. Negative for weakness, extremity weakness, paresthesias, involuntary movement, seizure and +lightheadedness, +syncope.      Allergies  Strawberry  Home Medications   Prior to Admission medications   Medication Sig Start Date End Date Taking? Authorizing Provider  Esomeprazole Magnesium (NEXIUM PO) Take 22.3 mg by mouth daily.   Yes Historical Provider, MD  ferrous sulfate 325 (65 FE) MG tablet Take 325 mg by mouth daily with breakfast.   Yes Historical Provider, MD  Fluticasone-Salmeterol (ADVAIR) 250-50 MCG/DOSE AEPB Inhale 1 puff into the lungs 2 (two) times daily.   Yes Historical Provider, MD  ipratropium (ATROVENT HFA) 17 MCG/ACT inhaler Inhale 2 puffs into the lungs 4 (four) times daily.   Yes Historical Provider, MD  Multiple Vitamins-Minerals (COMPLETE WOMENS) TABS Take 1 tablet by mouth daily.   Yes Historical Provider, MD   BP 166/85  Pulse 83  Temp(Src) 97.7 F (36.5 C) (Oral)  Resp 20  Ht 5\' 2"  (1.575 m)  Wt 90 lb (40.824 kg)  BMI 16.46 kg/m2  SpO2 100% Physical Exam 1755: Physical examination:  Nursing  notes reviewed; Vital signs and O2 SAT reviewed;  Constitutional: Thin, frail. In no acute distress; Head:  Normocephalic, atraumatic; Eyes: EOMI, PERRL, No scleral icterus; ENMT: Mouth and pharynx normal, Mucous membranes dry; Neck: Supple, Full range of motion, No lymphadenopathy; Cardiovascular: Regular rate and rhythm, No gallop; Respiratory: Breath sounds clear & equal bilaterally, No wheezes.  Speaking full sentences with  ease, Normal respiratory effort/excursion; Chest: Nontender, Movement normal; Abdomen: Soft, Nontender, Nondistended, Normal bowel sounds; Genitourinary: No CVA tenderness; Extremities: Pulses normal, No tenderness, No edema, No calf edema or asymmetry.; Neuro: AA&Ox3, Major CN grossly intact. No facial droop. Speech clear. Grips equal. Strength 5/5 equal bilat UE's and LE's, no drift x4 extremities. No gross focal motor or sensory deficits in extremities.; Skin: Color normal, Warm, Dry.   ED Course  Procedures     EKG Interpretation   Date/Time:  Friday October 01 2014 17:44:06 EDT Ventricular Rate:  76 PR Interval:  179 QRS Duration: 78 QT Interval:  419 QTC Calculation: 471 R Axis:   71 Text Interpretation:  Sinus rhythm Anteroseptal infarct, old Baseline  wander When compared with ECG of 08/21/2014 No significant change was found  Confirmed by [facility]  MD, Nicholos JohnsKATHLEEN [id number]) on 10/01/2014 6:17:04 PM      MDM  MDM Reviewed: previous chart, nursing note and vitals Reviewed previous: labs and ECG Interpretation: labs, ECG, x-ray and CT scan     Results for orders placed during the hospital encounter of 10/01/14  URINALYSIS, ROUTINE W REFLEX MICROSCOPIC      Result Value Ref Range   Color, Urine YELLOW  YELLOW   APPearance CLEAR  CLEAR   Specific Gravity, Urine 1.015  1.005 - 1.030   pH 6.0  5.0 - 8.0   Glucose, UA NEGATIVE  NEGATIVE mg/dL   Hgb urine dipstick NEGATIVE  NEGATIVE   Bilirubin Urine NEGATIVE  NEGATIVE   Ketones, ur TRACE (*) NEGATIVE mg/dL   Protein, ur NEGATIVE  NEGATIVE mg/dL   Urobilinogen, UA 0.2  0.0 - 1.0 mg/dL   Nitrite NEGATIVE  NEGATIVE   Leukocytes, UA NEGATIVE  NEGATIVE  CBC WITH DIFFERENTIAL      Result Value Ref Range   WBC 6.2  4.0 - 10.5 K/uL   RBC 5.28 (*) 3.87 - 5.11 MIL/uL   Hemoglobin 15.5 (*) 12.0 - 15.0 g/dL   HCT 60.446.5 (*) 54.036.0 - 98.146.0 %   MCV 88.1  78.0 - 100.0 fL   MCH 29.4  26.0 - 34.0 pg   MCHC 33.3  30.0 - 36.0 g/dL   RDW  19.115.5  47.811.5 - 29.515.5 %   Platelets 266  150 - 400 K/uL   Neutrophils Relative % 77  43 - 77 %   Neutro Abs 4.8  1.7 - 7.7 K/uL   Lymphocytes Relative 14  12 - 46 %   Lymphs Abs 0.9  0.7 - 4.0 K/uL   Monocytes Relative 8  3 - 12 %   Monocytes Absolute 0.5  0.1 - 1.0 K/uL   Eosinophils Relative 1  0 - 5 %   Eosinophils Absolute 0.1  0.0 - 0.7 K/uL   Basophils Relative 0  0 - 1 %   Basophils Absolute 0.0  0.0 - 0.1 K/uL  BASIC METABOLIC PANEL      Result Value Ref Range   Sodium 137  137 - 147 mEq/L   Potassium 4.0  3.7 - 5.3 mEq/L   Chloride 98  96 - 112 mEq/L   CO2  25  19 - 32 mEq/L   Glucose, Bld 98  70 - 99 mg/dL   BUN 15  6 - 23 mg/dL   Creatinine, Ser 1.61  0.50 - 1.10 mg/dL   Calcium 9.5  8.4 - 09.6 mg/dL   GFR calc non Af Amer 87 (*) >90 mL/min   GFR calc Af Amer >90  >90 mL/min   Anion gap 14  5 - 15  LACTIC ACID, PLASMA      Result Value Ref Range   Lactic Acid, Venous 1.2  0.5 - 2.2 mmol/L  TROPONIN I      Result Value Ref Range   Troponin I <0.30  <0.30 ng/mL   Dg Chest 1 View 10/01/2014   CLINICAL DATA:  Loss of consciousness x2 today  EXAM: CHEST - 1 VIEW  COMPARISON:  August 21, 2014  FINDINGS: The heart size and mediastinal contours are stable. The aorta is tortuous. There is no focal infiltrate, pulmonary edema, or pleural effusion. The visualized skeletal structures are stable.  IMPRESSION: No active cardiopulmonary disease.   Electronically Signed   By: Sherian Rein M.D.   On: 10/01/2014 19:22   Ct Head Wo Contrast 10/01/2014   CLINICAL DATA:  Loss of consciousness, confusion  EXAM: CT HEAD WITHOUT CONTRAST  TECHNIQUE: Contiguous axial images were obtained from the base of the skull through the vertex without intravenous contrast.  COMPARISON:  None.  FINDINGS: No skull fracture is noted. No intracranial hemorrhage, mass effect or midline shift. No acute cortical infarction. No mass lesion is noted on this unenhanced scan. Moderate cerebral atrophy. Periventricular and  patchy subcortical white matter decreased attenuation probable due to chronic small vessel ischemic changes. Atherosclerotic calcifications of carotid siphon.  IMPRESSION: No acute intracranial abnormality. Moderate cerebral atrophy. Periventricular and patchy subcortical white matter decreased attenuation probable due to chronic small vessel ischemic changes pre   Electronically Signed   By: Natasha Mead M.D.   On: 10/01/2014 19:12    2040:  H/H elevated/hemeconcentrated; will continue IVF. Pt is not orthostatic during VS. Workup otherwise reassuring. Dx and testing d/w pt.  Questions answered.  Verb understanding, agreeable to admit. T/C to [facility]. Anne Hahn, case discussed, including:  HPI, pertinent PM/SHx, VS/PE, dx testing, ED course and treatment:  Agreeable to admit, requests to write temporary orders, obtain tele bed.   Samuel Jester, DO 10/03/14 2215

## 2014-10-02 DIAGNOSIS — R55 Syncope and collapse: Secondary | ICD-10-CM

## 2014-10-02 DIAGNOSIS — I319 Disease of pericardium, unspecified: Secondary | ICD-10-CM

## 2014-10-02 LAB — BASIC METABOLIC PANEL
Anion gap: 11 (ref 5–15)
BUN: 15 mg/dL (ref 6–23)
CO2: 25 meq/L (ref 19–32)
CREATININE: 0.59 mg/dL (ref 0.50–1.10)
Calcium: 9.1 mg/dL (ref 8.4–10.5)
Chloride: 104 mEq/L (ref 96–112)
GFR calc Af Amer: >90 mL/min (ref >90)
GFR calc non Af Amer: 85 mL/min — ABNORMAL LOW (ref >90)
GLUCOSE: 72 mg/dL (ref 70–99)
Potassium: 4.1 mEq/L (ref 3.7–5.3)
Sodium: 140 mEq/L (ref 137–147)

## 2014-10-02 LAB — CBC
HEMATOCRIT: 40.4 % (ref 36.0–46.0)
Hemoglobin: 13.1 g/dL (ref 12.0–15.0)
MCH: 28.6 pg (ref 26.0–34.0)
MCHC: 32.4 g/dL (ref 30.0–36.0)
MCV: 88.2 fL (ref 78.0–100.0)
Platelets: 265 10*3/uL (ref 150–400)
RBC: 4.58 MIL/uL (ref 3.87–5.11)
RDW: 15.1 % (ref 11.5–15.5)
WBC: 4.8 10*3/uL (ref 4.0–10.5)

## 2014-10-02 MED ORDER — ENOXAPARIN SODIUM 30 MG/0.3ML ~~LOC~~ SOLN
30.0000 mg | SUBCUTANEOUS | Status: DC
  Administered 2014-10-03: 30 mg via SUBCUTANEOUS
  Filled 2014-10-02: qty 0.3

## 2014-10-02 MED ORDER — IPRATROPIUM BROMIDE 0.02 % IN SOLN
0.5000 mg | Freq: Four times a day (QID) | RESPIRATORY_TRACT | Status: DC | PRN
Start: 2014-10-02 — End: 2014-10-03

## 2014-10-02 MED ORDER — ENSURE COMPLETE PO LIQD
237.0000 mL | Freq: Two times a day (BID) | ORAL | Status: DC
  Administered 2014-10-02 – 2014-10-03 (×4): 237 mL via ORAL

## 2014-10-02 MED ORDER — IPRATROPIUM BROMIDE 0.02 % IN SOLN: 0.5000 mg | RESPIRATORY_TRACT | Status: DC | PRN

## 2014-10-02 MED ORDER — IPRATROPIUM BROMIDE HFA 17 MCG/ACT IN AERS: 2.0000 | INHALATION_SPRAY | Freq: Four times a day (QID) | RESPIRATORY_TRACT | Status: DC | PRN

## 2014-10-02 NOTE — Progress Notes (Signed)
Echocardiogram 2D Echocardiogram has been performed.  Stacey DrainWhite, Siriah Treat J 10/02/2014, 10:30 AM

## 2014-10-02 NOTE — Progress Notes (Signed)
INITIAL NUTRITION ASSESSMENT  DOCUMENTATION CODES Per approved criteria  -Severe malnutrition in the context of chronic illness   Pt meets criteria for severe MALNUTRITION in the context of chronic illness as evidenced by <75% energy intake x 1 month, 9.4% wt loss x 1 month.  INTERVENTION: Ensure Complete po BID, each supplement provides 350 kcal and 13 grams of protein  NUTRITION DIAGNOSIS: Inadequate oral intake related to decreased appetite as evidenced by 9.4% wt loss x 1 month.   Goal: Pt will meet >90% of estimated nutritional needs  Monitor:  PO intake, labs, weight changes, I/O's  Reason for Assessment: MST=2  80 y.o. female  Admitting Dx: Syncope  Melanie Holland is a 80 y.o. female with recent admission for GI bleed who presents today for recurrent syncope. She says that she is having syncope episodes up to a few times per day, but typically not everyday. She feels light headed prior to the syncope and knows she is about to pass out. She lives with her brother, and states that he often will catch her and help her to the floor. This patient as recently admitted for a GI bleed and acute blood loss anemia. This problem has resolved. . She has no significant cardiac history or history of seizures. ED workup including EKG, CXR, CT head, orthostatic BP readings and labwork is largely WNL and unremarkable.   ASSESSMENT: Pt admitted for syncope. Awiting EKG. She is familiar to this RD due to a previous admission. Pt with hx of weight loss due to GI issues and decreased appetite. Noted further weight loss; pt has experienced a 9.4% wt loss x 1 month according to wt hx. Suspect inadequate intake due to pt hx and further weight loss. Nutrition-focused physical exam deferred at this time. Labs reviewed. All WDL.   Height: Ht Readings from Last 1 Encounters:  10/01/14 5\' 2"  (1.575 m)    Weight: Wt Readings from Last 1 Encounters:  10/01/14 87 lb 1.3 oz (39.5 kg)    Ideal Body  Weight: 110#  % Ideal Body Weight: 79%  Wt Readings from Last 10 Encounters:  10/01/14 87 lb 1.3 oz (39.5 kg)  08/22/14 96 lb (43.545 kg)  08/22/14 96 lb (43.545 kg)  08/22/14 96 lb (43.545 kg)  08/22/14 96 lb (43.545 kg)    Usual Body Weight: 96#  % Usual Body Weight: 91%  BMI:  Body mass index is 15.92 kg/(m^2). Underweight  Estimated Nutritional Needs: Kcal: 1200-1400 Protein: 51-61 grams Fluid: 1.2-1.4 L  Skin: lump on right arm due to blood draw  Diet Order: General  EDUCATION NEEDS: -No education needs identified at this time  No intake or output data in the 24 hours ending 10/02/14 0916  Last BM: 09/30/14  Labs:   Recent Labs Lab 10/01/14 1820 10/02/14 0605  NA 137 140  K 4.0 4.1  CL 98 104  CO2 25 25  BUN 15 15  CREATININE 0.54 0.59  CALCIUM 9.5 9.1  GLUCOSE 98 72    CBG (last 3)  No results found for this basename: GLUCAP,  in the last 72 hours  Scheduled Meds: . sodium chloride   Intravenous STAT  . enoxaparin (LOVENOX) injection  40 mg Subcutaneous Q24H  . mometasone-formoterol  2 puff Inhalation BID  . pantoprazole  40 mg Oral Daily  . sodium chloride  3 mL Intravenous Q12H    Continuous Infusions:   Past Medical History  Diagnosis Date  . Asthma   . GI bleeding 08/2014  .  Anemia   . Mild dementia   . Malnutrition     Past Surgical History  Procedure Laterality Date  . Abdominal hysterectomy    . Tonsillectomy    . Appendectomy    . Esophagogastroduodenoscopy N/A 08/22/2014    Procedure: ESOPHAGOGASTRODUODENOSCOPY (EGD);  Surgeon: Malissa HippoNajeeb U Rehman, MD;  [facility];  Service: Endoscopy;  Laterality: N/A;  . Colonoscopy N/A 08/24/2014    Procedure: COLONOSCOPY;  Surgeon: Malissa HippoNajeeb U Rehman, MD;  [facility];  Service: Endoscopy;  Laterality: N/A;    Lasaro Primm A. Mayford KnifeWilliams, RD, LDN Pager: [phone]

## 2014-10-02 NOTE — Progress Notes (Signed)
UR completed 

## 2014-10-02 NOTE — Progress Notes (Signed)
[name] stated she only used the Atrovent inhaler when her asthma is bothering her, then only 4 times a day. She is not having breathing problems so far on this admit.

## 2014-10-02 NOTE — Progress Notes (Signed)
[facility] PROGRESS NOTE  Melanie GladeWillie Holland ZOX:[id number] DOB: 04/23/1932 DOA: 10/01/2014 PCP: Quinn AxeOBERTSON, ANTHONY T, PA-C  Assessment/Plan: Syncope -Non-orthostatic. -Head CT negative. -EKG without abnormalities as well as current telemetry. -2-D echocardiogram has been done with results pending. -I believe she she will benefit from a heart monitor upon discharge to monitor her heart and I suspect she may be having arrhythmias contributing to her syncope.  Asthma -Well-controlled  History of recent GI bleed -No signs of active bleeding.  Code Status: Full code Family Communication: Patient only  Disposition Plan: Likely home in a.m. with upper results   Consultants:  None   Antibiotics:  None   Subjective: No current complaints  Objective: Filed Vitals:   10/02/14 0040 10/02/14 0702 10/02/14 0705 10/02/14 1529  BP:   129/68 103/53  Pulse:   69 71  Temp:   97.8 F (36.6 C) 98.1 F (36.7 C)  TempSrc:   Oral Oral  Resp:    20  Height:      Weight:      SpO2: 97% 98% 98% 97%   No intake or output data in the 24 hours ending 10/02/14 1726 Filed Weights   10/01/14 1747 10/01/14 2225  Weight: 40.824 kg (90 lb) 39.5 kg (87 lb 1.3 oz)    Exam:   General:  Alert, awake, oriented x3, wears corrective lenses  Cardiovascular: Regular rate and rhythm  Respiratory: Clear to auscultation bilaterally  Abdomen: Soft, nontender, nondistended, positive bowel sounds  Extremities: No clubbing, cyanosis or edema, positive pulses   Neurologic:  Grossly intact and nonfocal  Data Reviewed: Basic Metabolic Panel:  Recent Labs Lab 10/01/14 1820 10/02/14 0605  NA 137 140  K 4.0 4.1  CL 98 104  CO2 25 25  GLUCOSE 98 72  BUN 15 15  CREATININE 0.54 0.59  CALCIUM 9.5 9.1   Liver Function Tests: No results found for this basename: AST, ALT, ALKPHOS, BILITOT, PROT, ALBUMIN,  in the last 168 hours No results found for this basename: LIPASE, AMYLASE,   in the last 168 hours No results found for this basename: AMMONIA,  in the last 168 hours CBC:  Recent Labs Lab 10/01/14 1820 10/02/14 0605  WBC 6.2 4.8  NEUTROABS 4.8  --   HGB 15.5* 13.1  HCT 46.5* 40.4  MCV 88.1 88.2  PLT 266 265   Cardiac Enzymes:  Recent Labs Lab 10/01/14 1820  TROPONINI <0.30   BNP (last 3 results) No results found for this basename: PROBNP,  in the last 8760 hours CBG: No results found for this basename: GLUCAP,  in the last 168 hours  No results found for this or any previous visit (from the past 240 hour(s)).   Studies: Dg Chest 1 View  10/01/2014   CLINICAL DATA:  Loss of consciousness x2 today  EXAM: CHEST - 1 VIEW  COMPARISON:  August 21, 2014  FINDINGS: The heart size and mediastinal contours are stable. The aorta is tortuous. There is no focal infiltrate, pulmonary edema, or pleural effusion. The visualized skeletal structures are stable.  IMPRESSION: No active cardiopulmonary disease.   Electronically Signed   By: Sherian ReinWei-Chen  Lin M.D.   On: 10/01/2014 19:22   Ct Head Wo Contrast  10/01/2014   CLINICAL DATA:  Loss of consciousness, confusion  EXAM: CT HEAD WITHOUT CONTRAST  TECHNIQUE: Contiguous axial images were obtained from the base of the skull through the vertex without intravenous contrast.  COMPARISON:  None.  FINDINGS: No skull fracture  is noted. No intracranial hemorrhage, mass effect or midline shift. No acute cortical infarction. No mass lesion is noted on this unenhanced scan. Moderate cerebral atrophy. Periventricular and patchy subcortical white matter decreased attenuation probable due to chronic small vessel ischemic changes. Atherosclerotic calcifications of carotid siphon.  IMPRESSION: No acute intracranial abnormality. Moderate cerebral atrophy. Periventricular and patchy subcortical white matter decreased attenuation probable due to chronic small vessel ischemic changes pre   Electronically Signed   By: Natasha MeadLiviu  Pop M.D.   On:  10/01/2014 19:12    Scheduled Meds: . [START ON 10/03/2014] enoxaparin (LOVENOX) injection  30 mg Subcutaneous Q24H  . feeding supplement (ENSURE COMPLETE)  237 mL Oral BID BM  . mometasone-formoterol  2 puff Inhalation BID  . pantoprazole  40 mg Oral Daily  . sodium chloride  3 mL Intravenous Q12H   Continuous Infusions:   Principal Problem:   Syncope Active Problems:   Melena   Asthma, chronic    Time spent: 35 minutes. Greater than 50% of this time was spent in direct contact with the patient coordinating care.    Chaya JanHERNANDEZ ACOSTA,ESTELA  [facility] Pager [phone]  If 7PM-7AM, please contact night-coverage at [email or web address], password [email or web address] 10/02/2014, 5:26 PM  LOS: 1 day

## 2014-10-03 DIAGNOSIS — R404 Transient alteration of awareness: Secondary | ICD-10-CM

## 2014-10-03 DIAGNOSIS — J4530 Mild persistent asthma, uncomplicated: Secondary | ICD-10-CM

## 2014-10-03 LAB — BASIC METABOLIC PANEL
Anion gap: 11 (ref 5–15)
BUN: 23 mg/dL (ref 6–23)
CHLORIDE: 105 meq/L (ref 96–112)
CO2: 24 mEq/L (ref 19–32)
CREATININE: 0.56 mg/dL (ref 0.50–1.10)
Calcium: 9 mg/dL (ref 8.4–10.5)
GFR calc non Af Amer: 86 mL/min — ABNORMAL LOW (ref >90)
Glucose, Bld: 83 mg/dL (ref 70–99)
Potassium: 4 mEq/L (ref 3.7–5.3)
Sodium: 140 mEq/L (ref 137–147)

## 2014-10-03 LAB — CBC
HCT: 38.2 % (ref 36.0–46.0)
HEMOGLOBIN: 12.6 g/dL (ref 12.0–15.0)
MCH: 29 pg (ref 26.0–34.0)
MCHC: 33 g/dL (ref 30.0–36.0)
MCV: 88 fL (ref 78.0–100.0)
Platelets: 257 10*3/uL (ref 150–400)
RBC: 4.34 MIL/uL (ref 3.87–5.11)
RDW: 15.4 % (ref 11.5–15.5)
WBC: 8.3 10*3/uL (ref 4.0–10.5)

## 2014-10-03 NOTE — Progress Notes (Signed)
10/03/14 1400  Mobility  Activity Ambulate in hall  Level of Assistance Independent  Assistive Device None  Distance Ambulated (ft) 400 ft  Ambulation Response Tolerated well  Patient educated on progressing activity slowly, and to stop and rest when she feels tired or dizzy.

## 2014-10-03 NOTE — Progress Notes (Signed)
NURSING PROGRESS NOTE  Melanie Holland [id number] Discharge Data: 10/03/2014 7:00 PM Attending Provider: No att. providers found QMV:[id number]:ROBERTSON, Claude MangesANTHONY T, PA-C   Melanie Holland to be D/C'd Home per MD order.    All IV's discontinued and monitored for bleeding.  All belongings returned to patient for patient to take home.  AVS summary reviewed with patient and family.   Patient left floor via wheelchair, escorted by RN/  Last Documented Vital Signs:  Blood pressure 110/71, pulse 80, temperature 98.1 F (36.7 C), temperature source Oral, resp. rate 20, height 5\' 2"  (1.575 m), weight 39.5 kg (87 lb 1.3 oz), SpO2 98.00%.  Mertha BaarsHorine, Miachel Nardelli D

## 2014-10-03 NOTE — Discharge Summary (Signed)
Physician Discharge Summary  Melanie Holland ZOX:[id number] DOB: December 11, 1932 DOA: 10/01/2014  PCP: Quinn AxeOBERTSON, ANTHONY T, PA-C  Admit date: 10/01/2014 Discharge date: 10/03/2014  Time spent: 45 minutes  Recommendations for Outpatient Follow-up:  -Will be discharged home today. -Cardiology office will call her for set up of an event monitor. -Advice to followup with her primary care provider in one week.   Discharge Diagnoses:  Principal Problem:   Syncope Active Problems:   Melena   Asthma, chronic   Discharge Condition: Stable and improved  Filed Weights   10/01/14 1747 10/01/14 2225  Weight: 40.824 kg (90 lb) 39.5 kg (87 lb 1.3 oz)    History of present illness:  Melanie GladeWillie Aymond is a 80 y.o. female with recent admission for GI bleed who presents today for recurrent syncope. Pt states that this problem started about 1 1/2 to 2 weeks ago. She says that she is having syncope episodes up to a few times per day, but typically not everyday. She feels light headed prior to the syncope and knows she is about to pass out. She lives with her brother, and states that he often will catch her and help her to the floor. This patient as recently admitted for a GI bleed and acute blood loss anemia. This problem has resolved. She denies any seizure activity or post ictal state or loss of bowel or bladder continence with these syncopal episodes. She has no significant cardiac history or history of seizures. These episodes occur at home usually, and sometimes the patient is able to alleviate the symptoms by sitting down or laying down. The syncope does not occur when she "stays in bed". ED workup including EKG, CXR, CT head, orthostatic BP readings and labwork is largely WNL and unremarkable. Hgb is 15.   Hospital Course:   Syncope  -Non-orthostatic.  -Head CT negative.  -EKG without abnormalities as well as current telemetry.  -2-D echocardiogram: Ejection fraction of 75-80%, normal wall  motion, grade 1 diastolic dysfunction. -I believe she she will benefit from a heart monitor upon discharge to rule out any arrhythmias that could be contributing to her syncope.   Asthma  -Well-controlled   History of recent GI bleed  -No signs of active bleeding.   Procedures:  None   Consultations:  None  Discharge Instructions  Discharge Instructions   Increase activity slowly    Complete by:  As directed             Medication List         COMPLETE WOMENS Tabs  Take 1 tablet by mouth daily.     ferrous sulfate 325 (65 FE) MG tablet  Take 325 mg by mouth daily with breakfast.     Fluticasone-Salmeterol 250-50 MCG/DOSE Aepb  Commonly known as:  ADVAIR  Inhale 1 puff into the lungs 2 (two) times daily.     ipratropium 17 MCG/ACT inhaler  Commonly known as:  ATROVENT HFA  Inhale 2 puffs into the lungs 4 (four) times daily.     NEXIUM PO  Take 22.3 mg by mouth daily.       Allergies  Allergen Reactions  . Strawberry        Follow-up Information   Follow up with ROBERTSON, ANTHONY T, PA-C. Schedule an appointment as soon as possible for a visit in 2 weeks.   Specialty:  Physician Assistant   Contact information:   [address] [place] [address] [phone]  Please follow up. (cardiology office will call you to set up an event monitor)        The results of significant diagnostics from this hospitalization (including imaging, microbiology, ancillary and laboratory) are listed below for reference.    Significant Diagnostic Studies: Dg Chest 1 View  10/01/2014   CLINICAL DATA:  Loss of consciousness x2 today  EXAM: CHEST - 1 VIEW  COMPARISON:  August 21, 2014  FINDINGS: The heart size and mediastinal contours are stable. The aorta is tortuous. There is no focal infiltrate, pulmonary edema, or pleural effusion. The visualized skeletal structures are stable.  IMPRESSION: No active cardiopulmonary disease.   Electronically Signed   By:  Sherian ReinWei-Chen  Lin M.D.   On: 10/01/2014 19:22   Ct Head Wo Contrast  10/01/2014   CLINICAL DATA:  Loss of consciousness, confusion  EXAM: CT HEAD WITHOUT CONTRAST  TECHNIQUE: Contiguous axial images were obtained from the base of the skull through the vertex without intravenous contrast.  COMPARISON:  None.  FINDINGS: No skull fracture is noted. No intracranial hemorrhage, mass effect or midline shift. No acute cortical infarction. No mass lesion is noted on this unenhanced scan. Moderate cerebral atrophy. Periventricular and patchy subcortical white matter decreased attenuation probable due to chronic small vessel ischemic changes. Atherosclerotic calcifications of carotid siphon.  IMPRESSION: No acute intracranial abnormality. Moderate cerebral atrophy. Periventricular and patchy subcortical white matter decreased attenuation probable due to chronic small vessel ischemic changes pre   Electronically Signed   By: Natasha MeadLiviu  Pop M.D.   On: 10/01/2014 19:12    Microbiology: No results found for this or any previous visit (from the past 240 hour(s)).   Labs: Basic Metabolic Panel:  Recent Labs Lab 10/01/14 1820 10/02/14 0605 10/03/14 0555  NA 137 140 140  K 4.0 4.1 4.0  CL 98 104 105  CO2 25 25 24   GLUCOSE 98 72 83  BUN 15 15 23   CREATININE 0.54 0.59 0.56  CALCIUM 9.5 9.1 9.0   Liver Function Tests: No results found for this basename: AST, ALT, ALKPHOS, BILITOT, PROT, ALBUMIN,  in the last 168 hours No results found for this basename: LIPASE, AMYLASE,  in the last 168 hours No results found for this basename: AMMONIA,  in the last 168 hours CBC:  Recent Labs Lab 10/01/14 1820 10/02/14 0605 10/03/14 0555  WBC 6.2 4.8 8.3  NEUTROABS 4.8  --   --   HGB 15.5* 13.1 12.6  HCT 46.5* 40.4 38.2  MCV 88.1 88.2 88.0  PLT 266 265 257   Cardiac Enzymes:  Recent Labs Lab 10/01/14 1820  TROPONINI <0.30   BNP: BNP (last 3 results) No results found for this basename: PROBNP,  in the last  8760 hours CBG: No results found for this basename: GLUCAP,  in the last 168 hours     Signed:  Chaya JanHERNANDEZ ACOSTA,Holleigh C[place]field  [facility] Pager: [phone] 10/03/2014, 3:33 PM

## 2014-10-03 NOTE — Progress Notes (Signed)
UR completed 

## 2014-10-04 ENCOUNTER — Telehealth: Payer: Self-pay

## 2014-10-04 ENCOUNTER — Other Ambulatory Visit: Payer: Self-pay

## 2014-10-04 DIAGNOSIS — R55 Syncope and collapse: Secondary | ICD-10-CM

## 2014-10-04 LAB — URINE CULTURE

## 2014-10-04 NOTE — Telephone Encounter (Signed)
Spoke with pt, she will come tomorrow around 2 pm for event monitor

## 2014-10-05 ENCOUNTER — Ambulatory Visit (INDEPENDENT_AMBULATORY_CARE_PROVIDER_SITE_OTHER): Payer: Medicare Other | Admitting: *Deleted

## 2014-10-05 DIAGNOSIS — R55 Syncope and collapse: Secondary | ICD-10-CM

## 2014-10-05 NOTE — Patient Instructions (Signed)
I have given you instructions on how to place your even monitor and have placed your event monitor on you today to wear 30 days.  Thank you for choosing [facility]!!

## 2014-10-05 NOTE — Progress Notes (Signed)
Placed 30 day event monitor on pt and gave verbal instructions as well as hand written instructions along with booklet. Pt verbalized understanding.

## 2014-10-06 DIAGNOSIS — R55 Syncope and collapse: Secondary | ICD-10-CM

## 2014-11-08 ENCOUNTER — Telehealth: Payer: Self-pay | Admitting: *Deleted

## 2014-11-08 NOTE — Telephone Encounter (Signed)
EOS report event monitor received in Dr. Purvis SheffieldKoneswaran folder

## 2014-11-30 ENCOUNTER — Other Ambulatory Visit: Payer: Self-pay | Admitting: *Deleted

## 2014-11-30 ENCOUNTER — Telehealth: Payer: Self-pay

## 2014-11-30 DIAGNOSIS — R55 Syncope and collapse: Secondary | ICD-10-CM

## 2014-11-30 NOTE — Telephone Encounter (Signed)
Pt given results that monitor revealed SR, no sx's

## 2015-01-06 ENCOUNTER — Encounter (HOSPITAL_COMMUNITY): Payer: Self-pay | Admitting: Internal Medicine

## 2015-04-26 IMAGING — CR DG CHEST 1V PORT
1 series · 1 of 1 positions shown · non-contrast
Comparison: 12/01/2013

CLINICAL DATA: Weakness and shortness of breath.

EXAM:
PORTABLE CHEST - 1 VIEW

[portable]
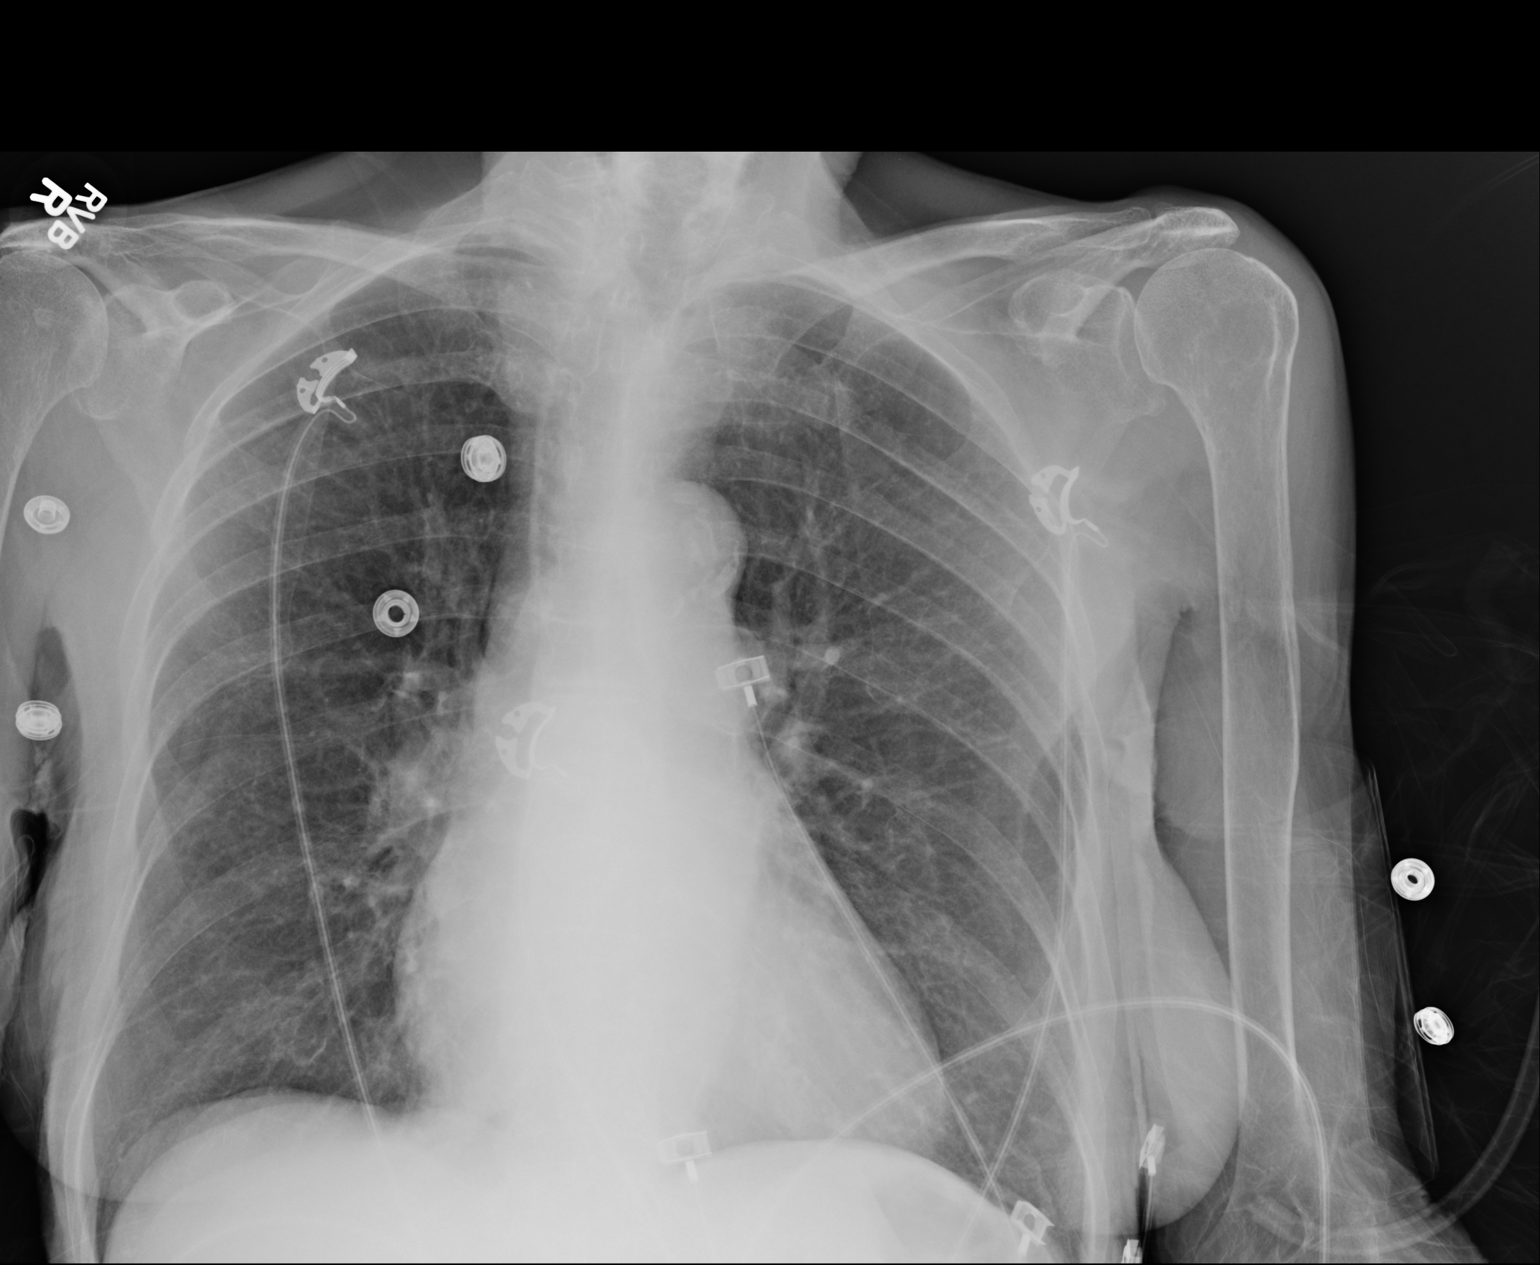

[1 of 1 positions shown; findings below may reference images not displayed]

FINDINGS: Normal heart size and pulmonary vascularity. Emphysematous changes
and scattered fibrosis in the lungs. No focal airspace disease or
consolidation. No blunting of costophrenic angles. No pneumothorax.
Calcification and torsion of the aorta.
IMPRESSION: Emphysematous changes in the lungs. No evidence of active pulmonary
disease.

## 2015-06-06 IMAGING — CT CT HEAD W/O CM
1 series · 16 of 30 positions shown, 20 images · non-contrast
Comparison: None.

CLINICAL DATA: Loss of consciousness, confusion

EXAM:
CT HEAD WITHOUT CONTRAST
TECHNIQUE: Contiguous axial images were obtained from the base of the skull
through the vertex without intravenous contrast.

[Series 2: headtrauma 4.8 h37s · axial · 0.46mm/px · z∈[+86,+223]mm · 16 of 30 slices shown, 20 images]
[im 2/30  brain]
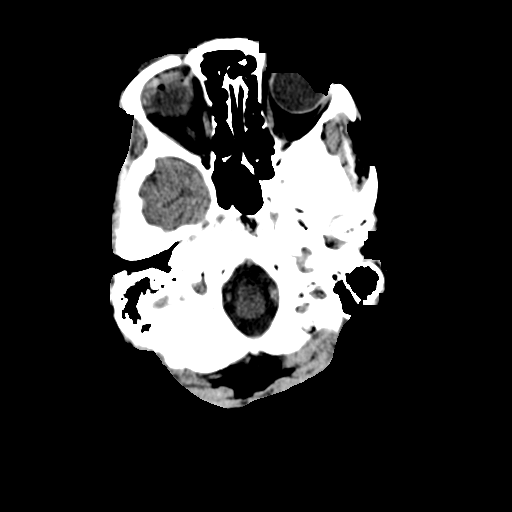
[im 2/30  bone]
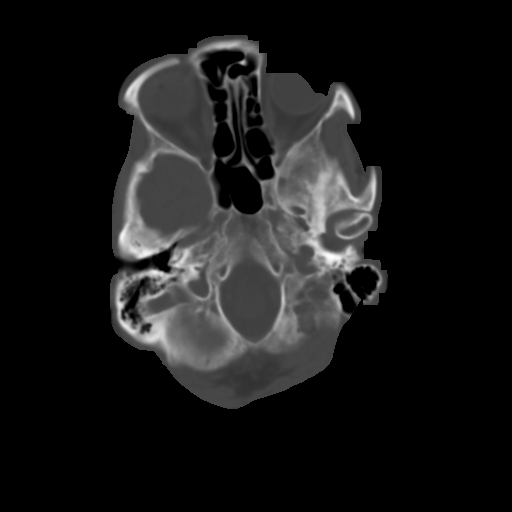
[im 4/30  brain]
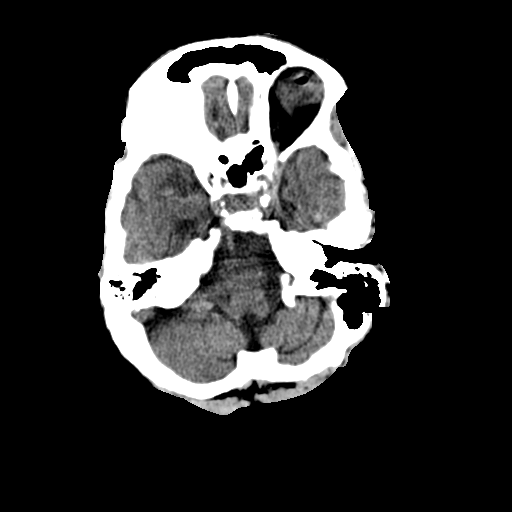
[im 6/30  brain]
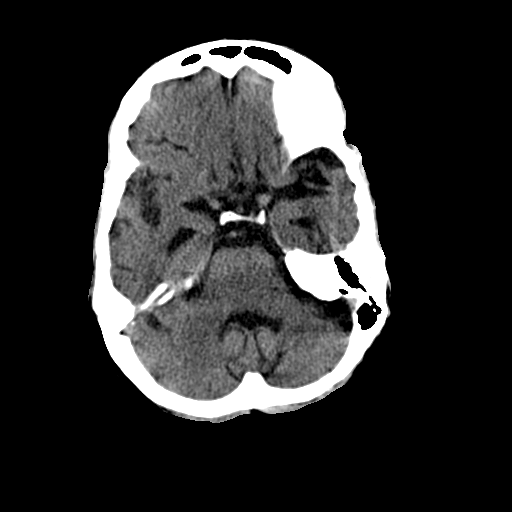
[im 8/30  brain]
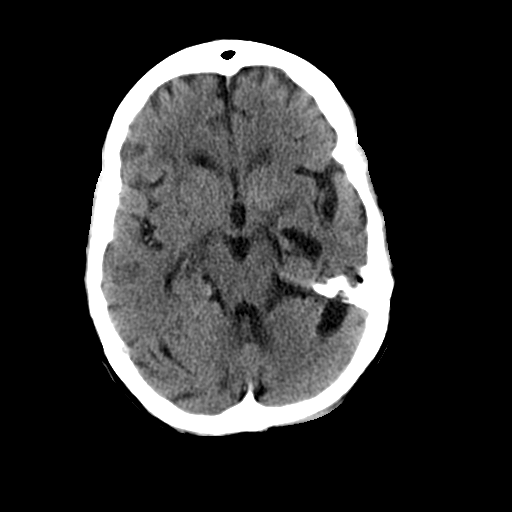
[im 9/30  brain]
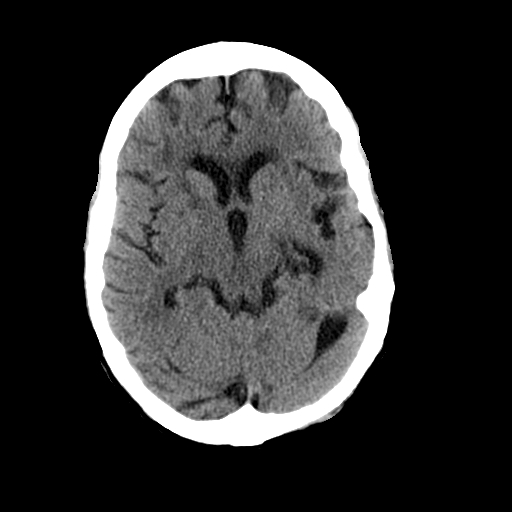
[im 9/30  bone]
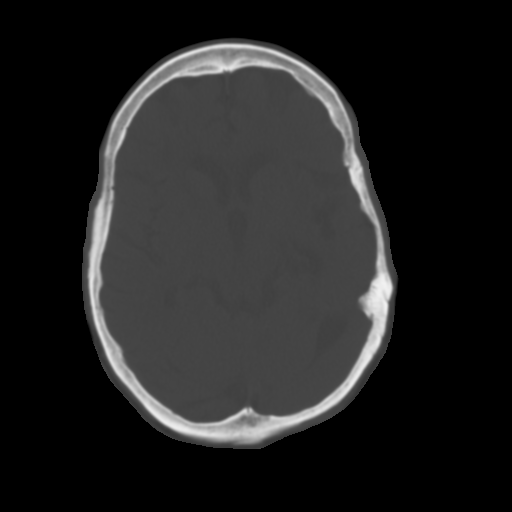
[im 11/30  brain]
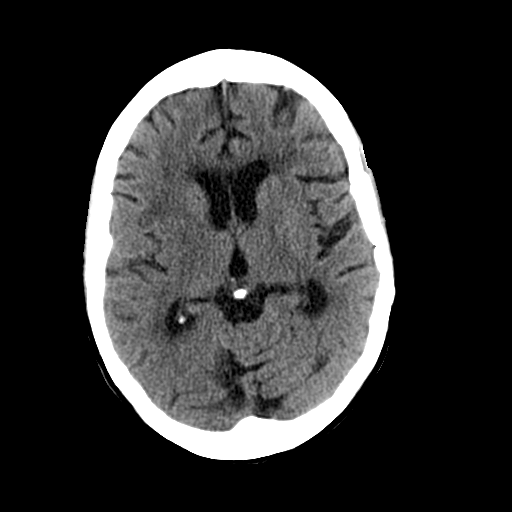
[im 13/30  brain]
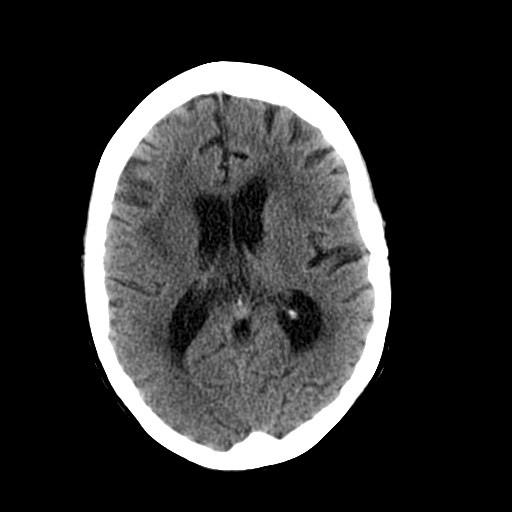
[im 15/30  brain]
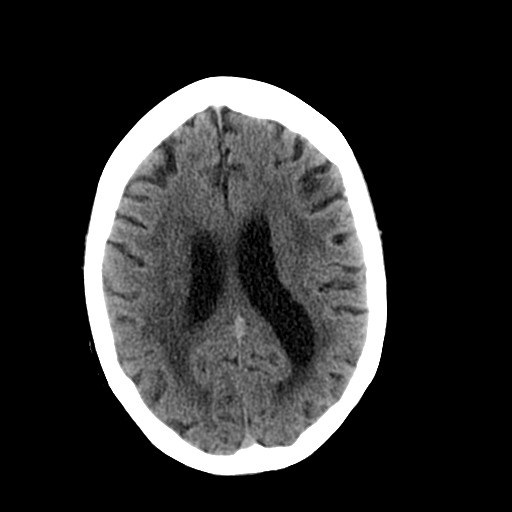
[im 16/30  brain]
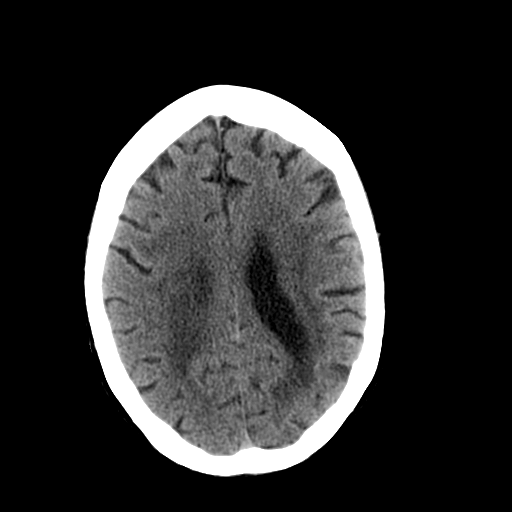
[im 16/30  bone]
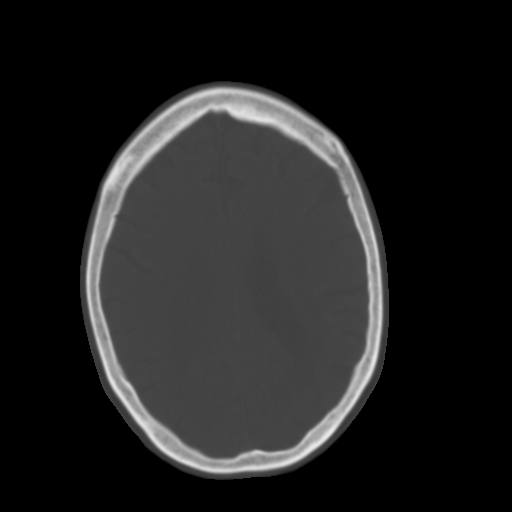
[im 18/30  brain]
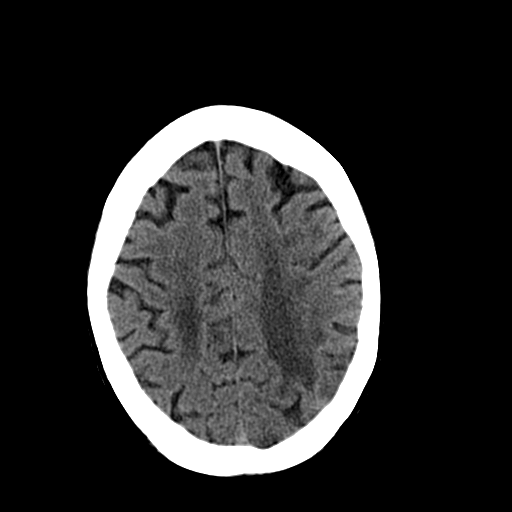
[im 20/30  brain]
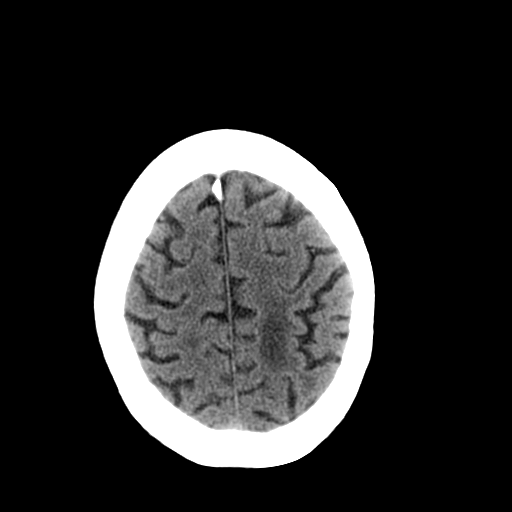
[im 22/30  brain]
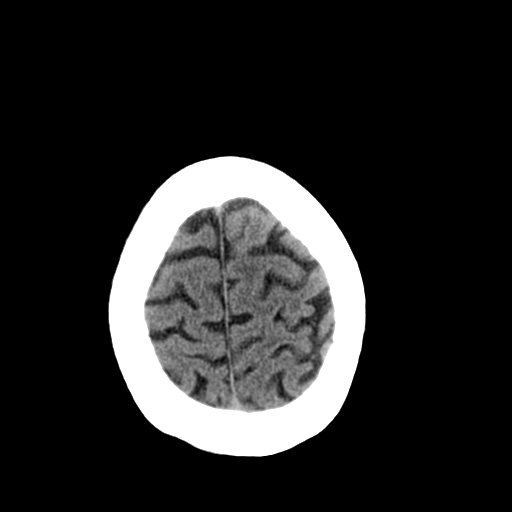
[im 23/30  brain]
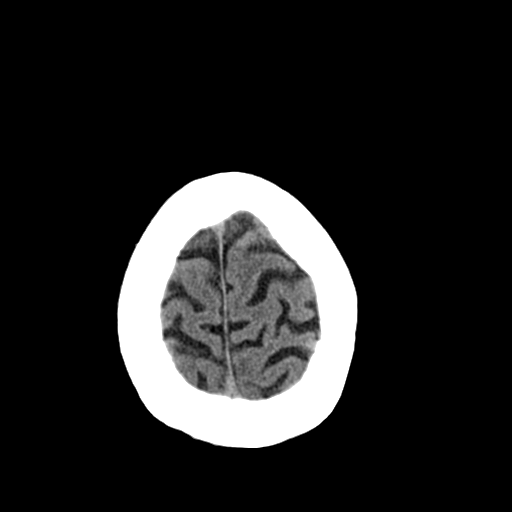
[im 23/30  bone]
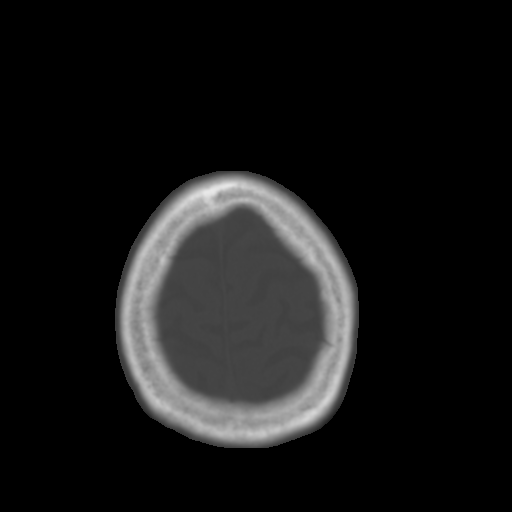
[im 25/30  brain]
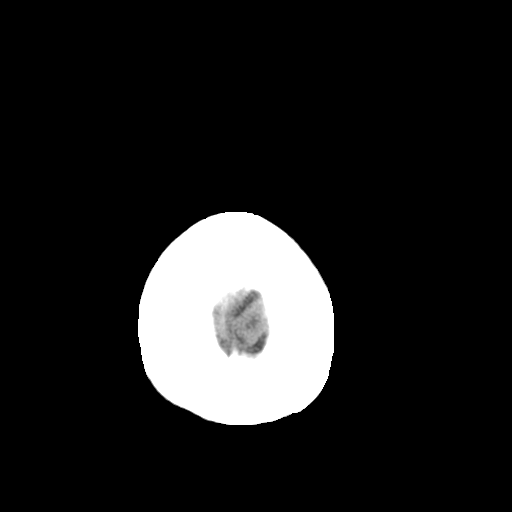
[im 27/30  brain]
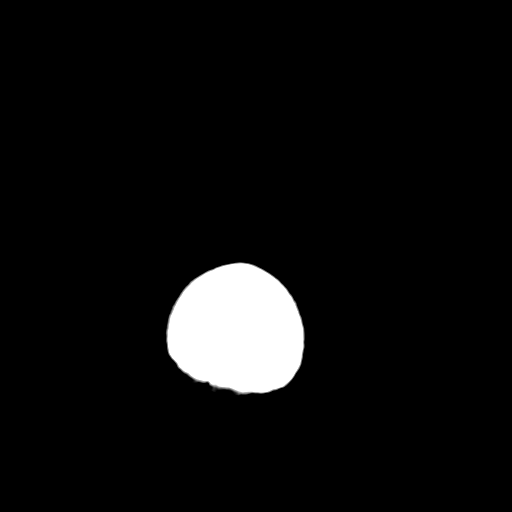
[im 29/30  brain]
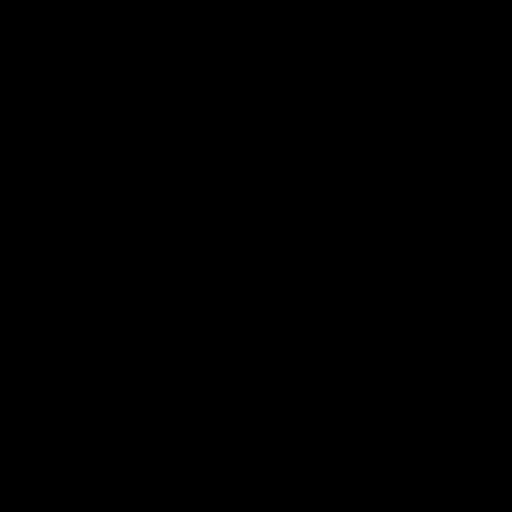

[16 of 30 positions shown; findings below may reference images not displayed]

FINDINGS: No skull fracture is noted. No intracranial hemorrhage, mass effect
or midline shift. No acute cortical infarction. No mass lesion is
noted on this unenhanced scan. Moderate cerebral atrophy.
Periventricular and patchy subcortical white matter decreased
attenuation probable due to chronic small vessel ischemic changes.
Atherosclerotic calcifications of carotid siphon.
IMPRESSION: No acute intracranial abnormality. Moderate cerebral atrophy.
Periventricular and patchy subcortical white matter decreased
attenuation probable due to chronic small vessel ischemic changes
pre

## 2015-07-25 DEATH — deceased
